# Patient Record
Sex: Female | Born: 1980 | Race: White | Hispanic: No | Marital: Married | Smoking: Never smoker
Health system: Southern US, Community
[De-identification: ages and names within clinical notes are randomized; demographics above are authoritative.]

---

## 2004-09-13 ENCOUNTER — Ambulatory Visit (HOSPITAL_COMMUNITY): Admission: RE | Admit: 2004-09-13 | Discharge: 2004-09-13 | Payer: Self-pay | Admitting: Family Medicine

## 2004-09-14 ENCOUNTER — Ambulatory Visit (HOSPITAL_COMMUNITY): Admission: RE | Admit: 2004-09-14 | Discharge: 2004-09-14 | Payer: Self-pay | Admitting: Family Medicine

## 2005-08-08 ENCOUNTER — Emergency Department (HOSPITAL_COMMUNITY): Admission: EM | Admit: 2005-08-08 | Discharge: 2005-08-08 | Payer: Self-pay | Admitting: Emergency Medicine

## 2005-11-02 ENCOUNTER — Observation Stay (HOSPITAL_COMMUNITY): Admission: AD | Admit: 2005-11-02 | Discharge: 2005-11-03 | Payer: Self-pay | Admitting: Obstetrics and Gynecology

## 2006-01-04 ENCOUNTER — Inpatient Hospital Stay (HOSPITAL_COMMUNITY): Admission: AD | Admit: 2006-01-04 | Discharge: 2006-01-07 | Payer: Self-pay | Admitting: Obstetrics and Gynecology

## 2006-10-24 ENCOUNTER — Ambulatory Visit: Payer: Self-pay | Admitting: Internal Medicine

## 2007-02-04 ENCOUNTER — Ambulatory Visit (HOSPITAL_COMMUNITY): Admission: RE | Admit: 2007-02-04 | Discharge: 2007-02-04 | Payer: Self-pay | Admitting: Internal Medicine

## 2007-02-04 ENCOUNTER — Ambulatory Visit: Payer: Self-pay | Admitting: Internal Medicine

## 2007-03-08 ENCOUNTER — Ambulatory Visit: Payer: Self-pay | Admitting: Internal Medicine

## 2008-06-25 ENCOUNTER — Other Ambulatory Visit: Admission: RE | Admit: 2008-06-25 | Discharge: 2008-06-25 | Payer: Self-pay | Admitting: Obstetrics and Gynecology

## 2008-10-14 ENCOUNTER — Ambulatory Visit: Payer: Self-pay | Admitting: Internal Medicine

## 2008-10-15 ENCOUNTER — Ambulatory Visit (HOSPITAL_COMMUNITY): Admission: RE | Admit: 2008-10-15 | Discharge: 2008-10-15 | Payer: Self-pay | Admitting: Internal Medicine

## 2008-11-04 ENCOUNTER — Ambulatory Visit: Payer: Self-pay | Admitting: Internal Medicine

## 2008-11-06 ENCOUNTER — Ambulatory Visit (HOSPITAL_COMMUNITY): Admission: RE | Admit: 2008-11-06 | Discharge: 2008-11-06 | Payer: Self-pay | Admitting: Family Medicine

## 2009-11-25 ENCOUNTER — Inpatient Hospital Stay (HOSPITAL_COMMUNITY): Admission: AD | Admit: 2009-11-25 | Discharge: 2009-11-27 | Payer: Self-pay | Admitting: Obstetrics & Gynecology

## 2009-11-25 ENCOUNTER — Ambulatory Visit: Payer: Self-pay | Admitting: Advanced Practice Midwife

## 2010-02-28 IMAGING — CT CT ABDOMEN W/ CM
1 of 3 series · 14 of 32 positions shown, 19 images · IV contrast (agent unspecified)
Comparison: None

CT ABDOMEN

CLINICAL DATA: Left side and lower abdominal pain, history rectal
bleeding, diverticulosis

CT ABDOMEN AND PELVIS WITH CONTRAST
TECHNIQUE: Multidetector CT imaging of the abdomen and pelvis was
performed using the standard protocol following bolus
administration of intravenous contrast. Sagittal and coronal MPR
images reconstructed from axial data set.
Contrast: Dilute oral contrast and 100 ml Mmnipaque-333

[Series 2: abd_pel 5.0 b40f · axial · 0.66mm/px · z∈[-471,-81]mm · 14 of 90 slices shown, 19 images]
[im 6/90  soft-tissue]
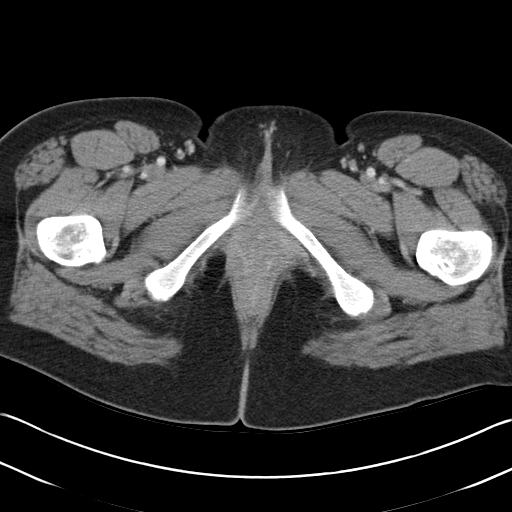
[im 6/90  bone]
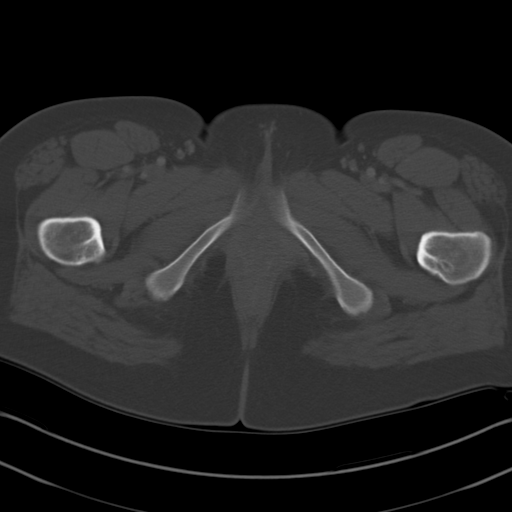
[im 11/90  soft-tissue]
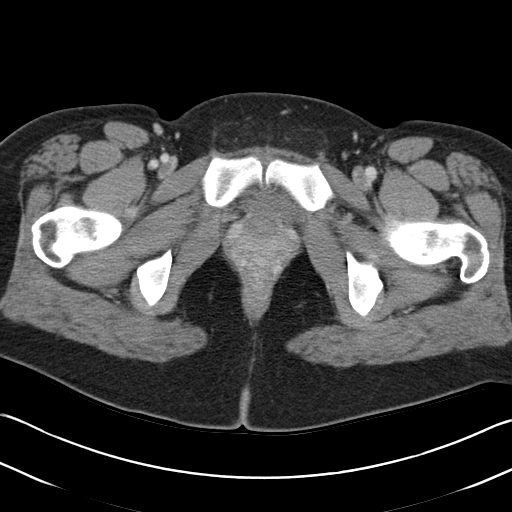
[im 21/90  soft-tissue]
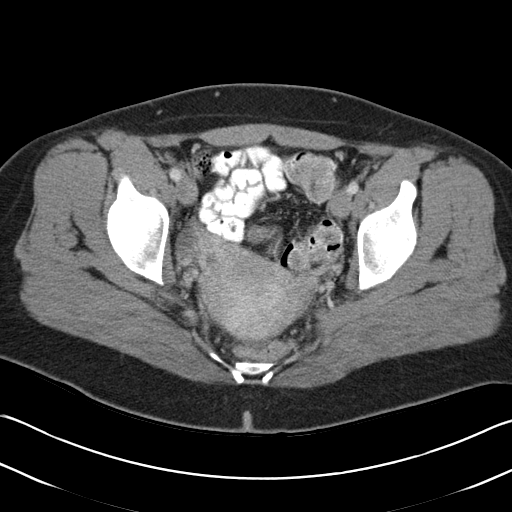
[im 27/90  soft-tissue]
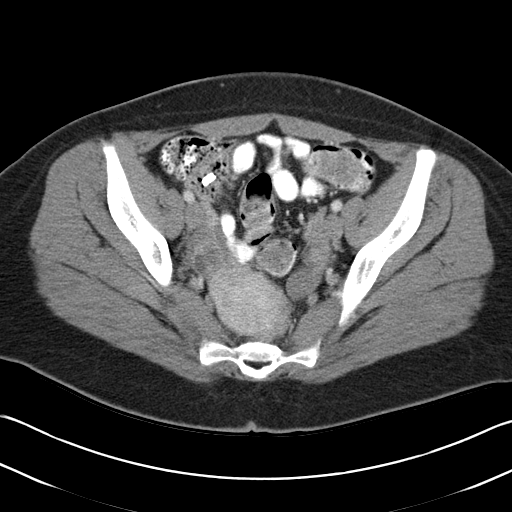
[im 32/90  soft-tissue]
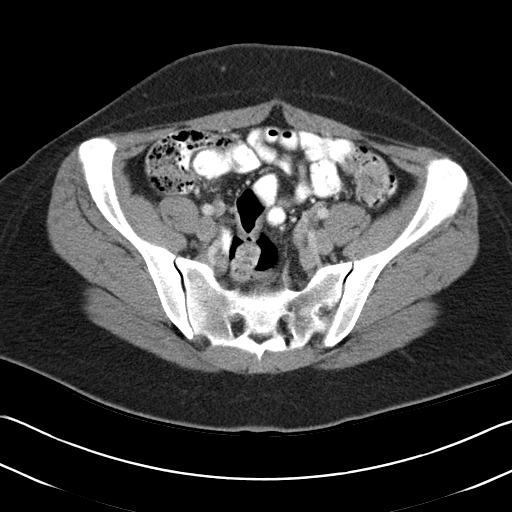
[im 37/90  soft-tissue]
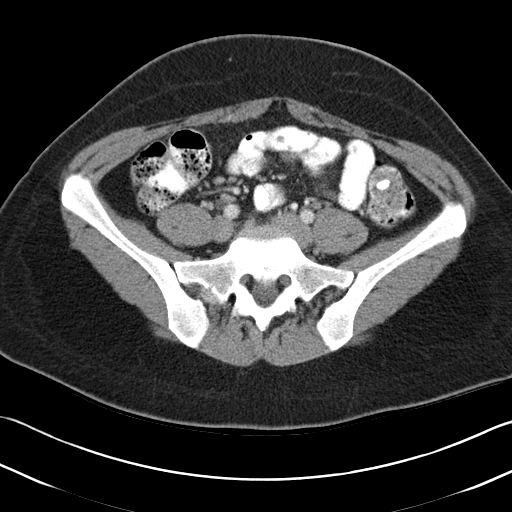
[im 48/90  soft-tissue]
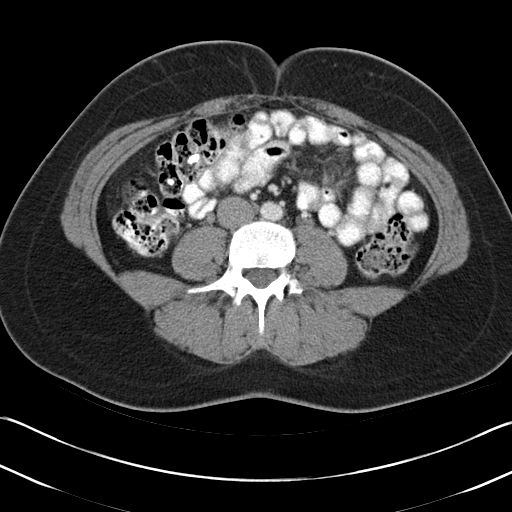
[im 53/90  soft-tissue]
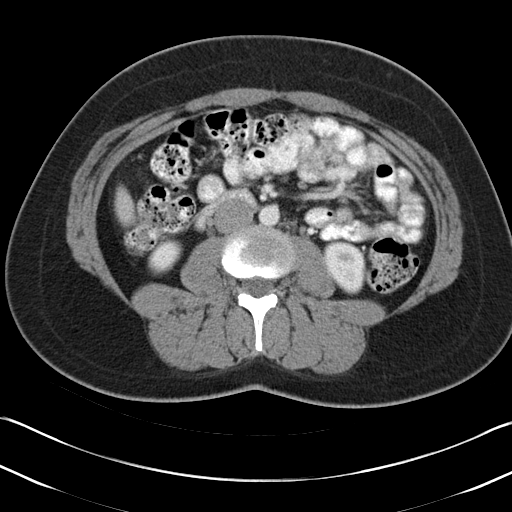
[im 58/90  soft-tissue]
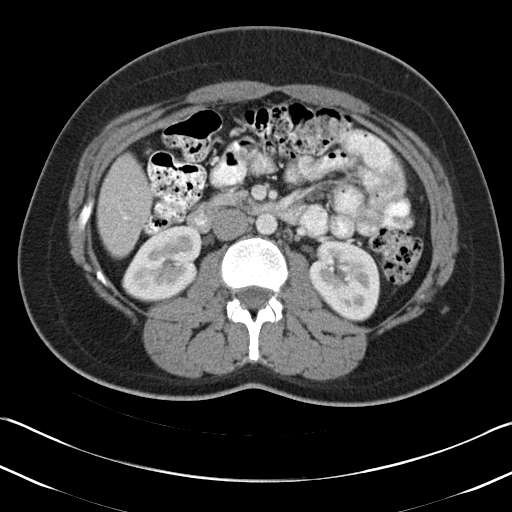
[im 58/90  bone]
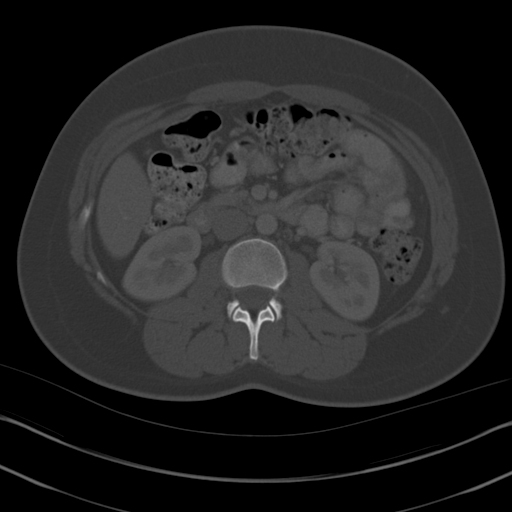
[im 63/90  soft-tissue]
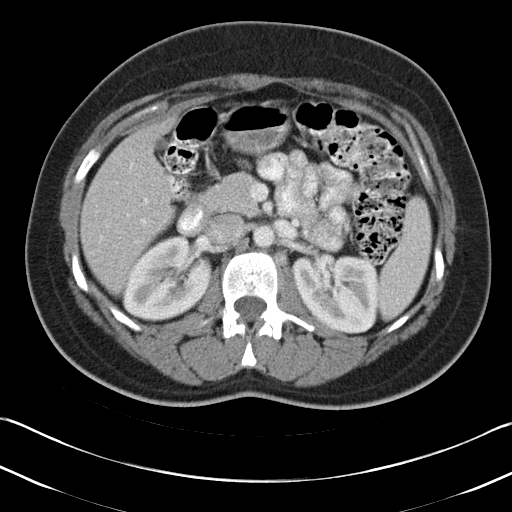
[im 69/90  soft-tissue]
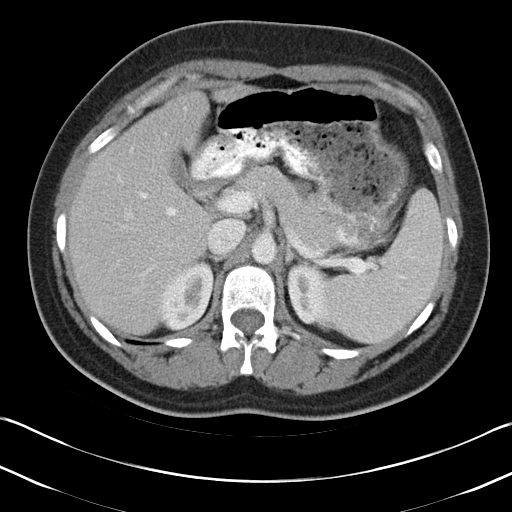
[im 69/90  lung]
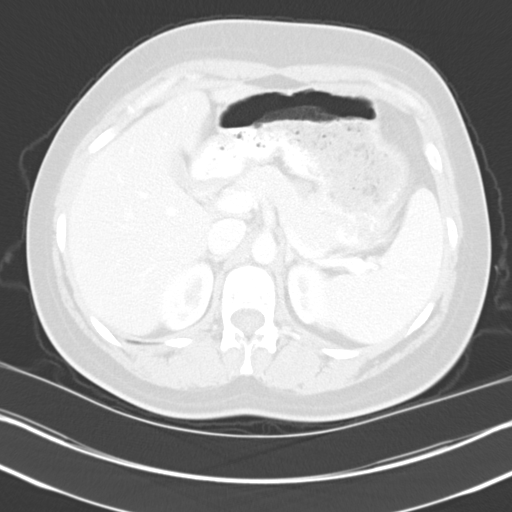
[im 74/90  lung]
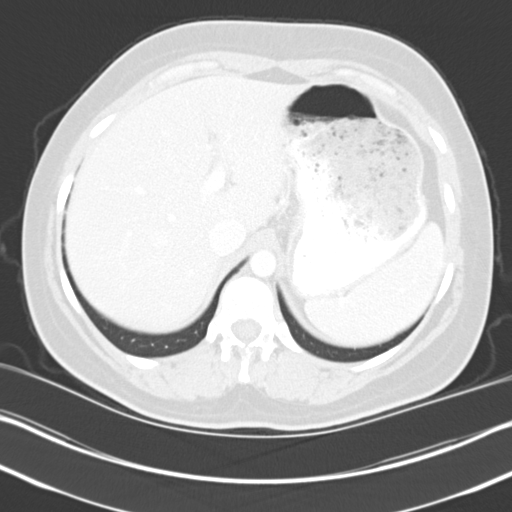
[im 79/90  soft-tissue]
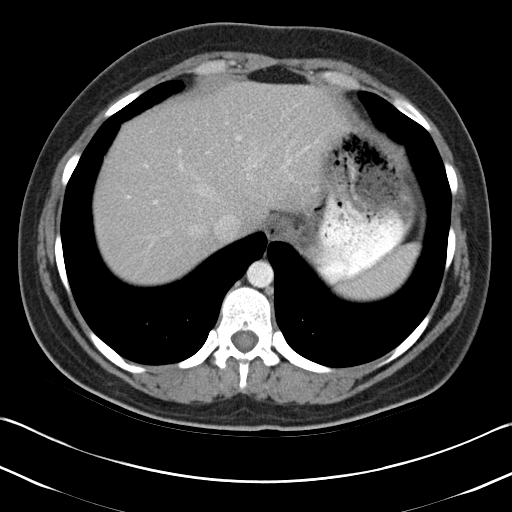
[im 79/90  lung]
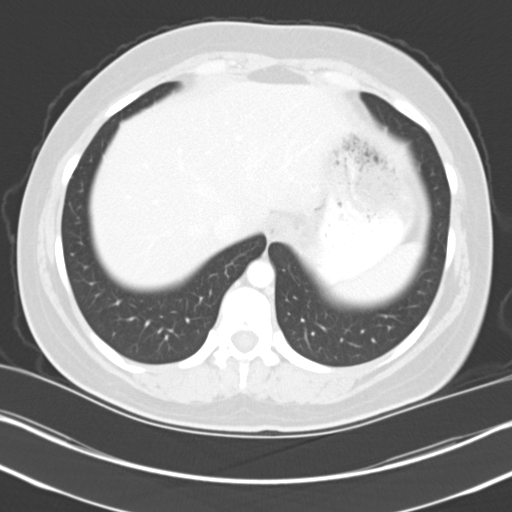
[im 84/90  soft-tissue]
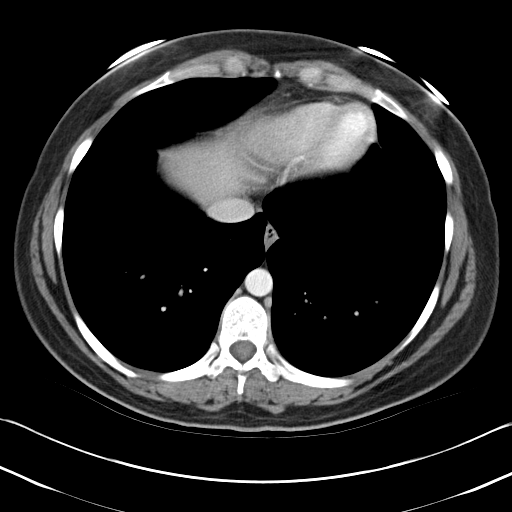
[im 84/90  lung]
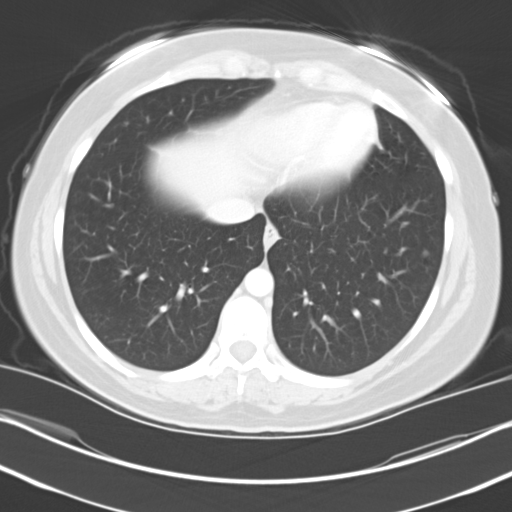

[14 of 32 positions shown; findings below may reference images not displayed]

FINDINGS: 5 mm diameter nonspecific nodular density left lower lobe image 6.
Liver, spleen, pancreas, kidneys, and adrenal glands normal.
Stomach and upper abdominal bowel loops unremarkable.
No mass, adenopathy, free fluid, or inflammatory process.
IMPRESSION: No acute upper abdominal abnormalities.
5 mm diameter nonspecific left lower lobe nodule, recommendations
below. If the patient is at high risk for bronchogenic carcinoma,
follow-up chest CT at 6-12 months is recommended.  If the patient
is at low risk for bronchogenic carcinoma, follow-up chest CT at 12
months is recommended.  This recommendation follows the consensus
statement: "Guidelines for Management of Small Pulmonary Nodules
Detected on CT Scans: A Statement from the [HOSPITAL]" as
[URL]

CT PELVIS
FINDINGS: Small cyst right ovary, 1.6 x 2.0 cm image 63.
Uterus and adnexae otherwise unremarkable.
Appendix not identified.
Large and small bowel loops in pelvis normal.
No pelvic mass, adenopathy, free fluid, or inflammatory process.
Bladder decompressed and unremarkable.
Bones unremarkable.
IMPRESSION: Small cyst right ovary.
Otherwise negative CT pelvis.

## 2010-03-22 IMAGING — US US EXTREM LOW VENOUS*L*
1 series · 14 of 24 positions shown · non-contrast
Comparison: None

CLINICAL DATA: Left leg pain and swelling



[Series 1: unknown · 14 of 38 slices shown]
[im 1/38]
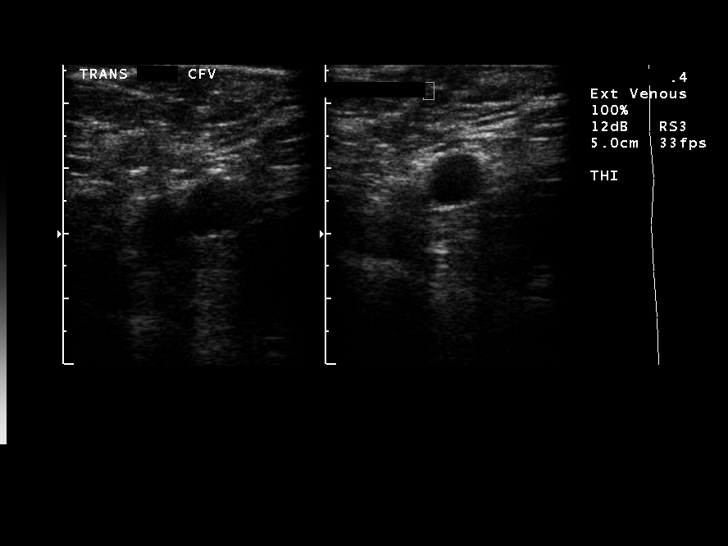
[im 4/38]
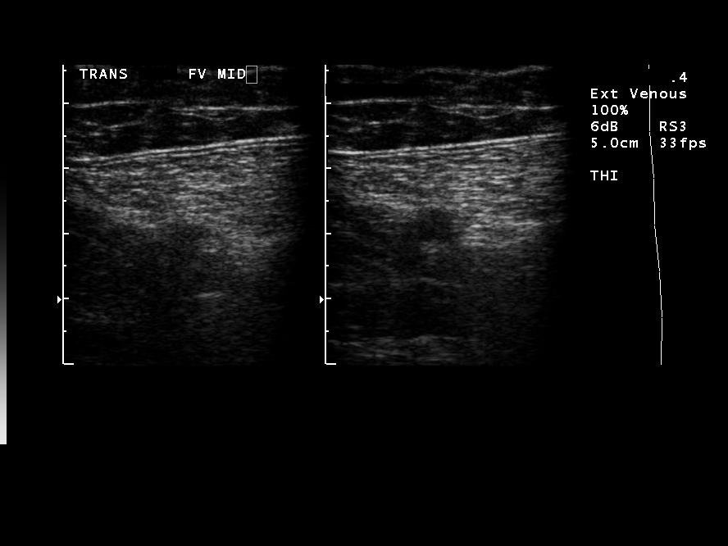
[im 7/38]
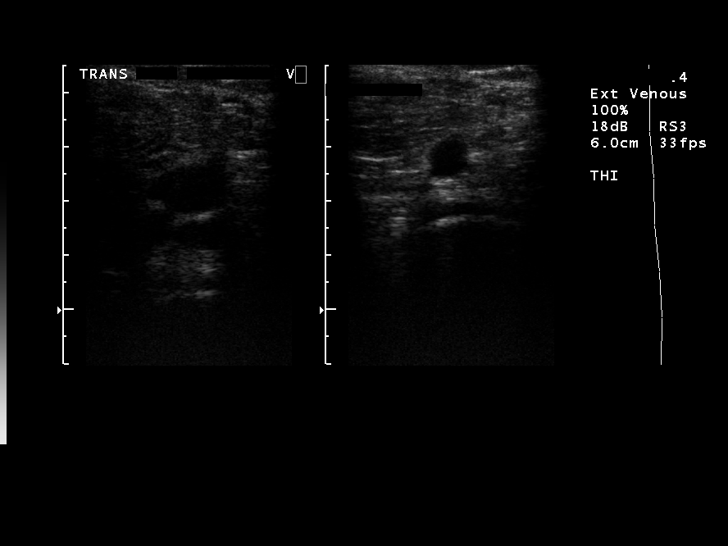
[im 10/38]
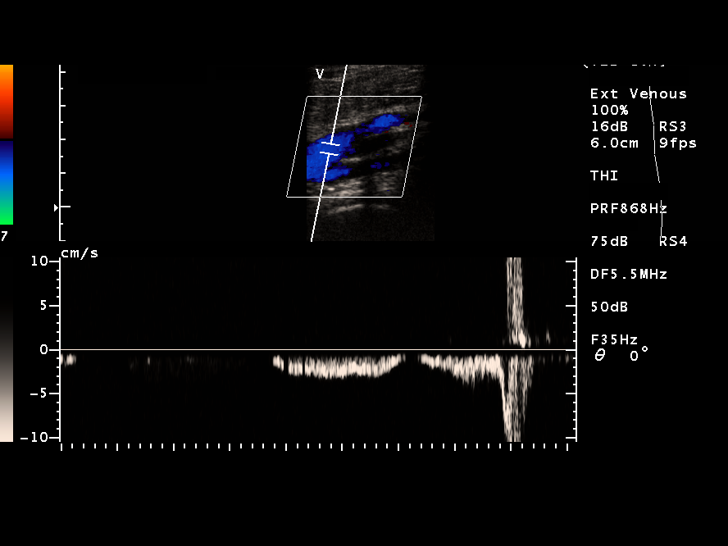
[im 12/38]
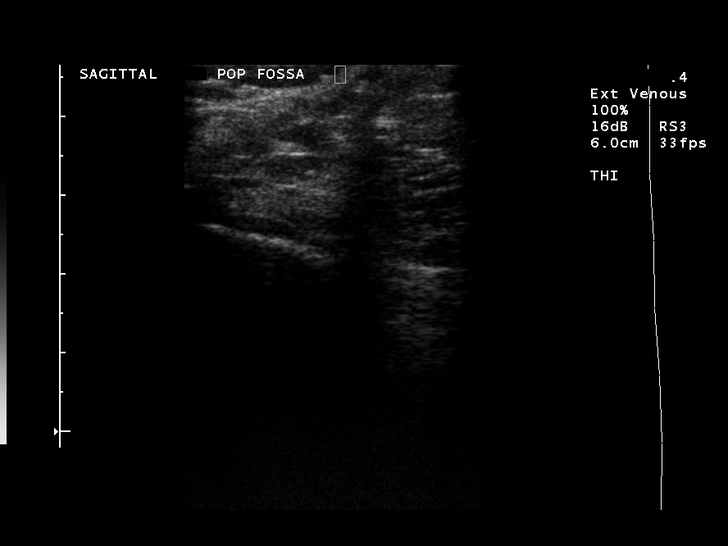
[im 15/38]
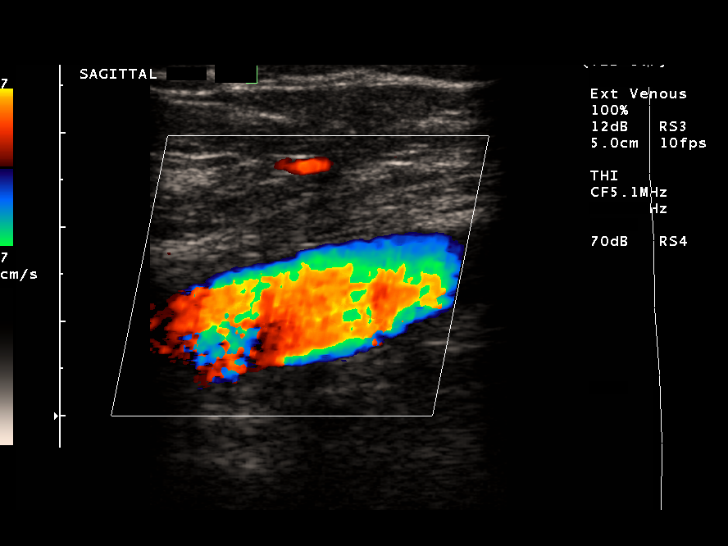
[im 18/38]
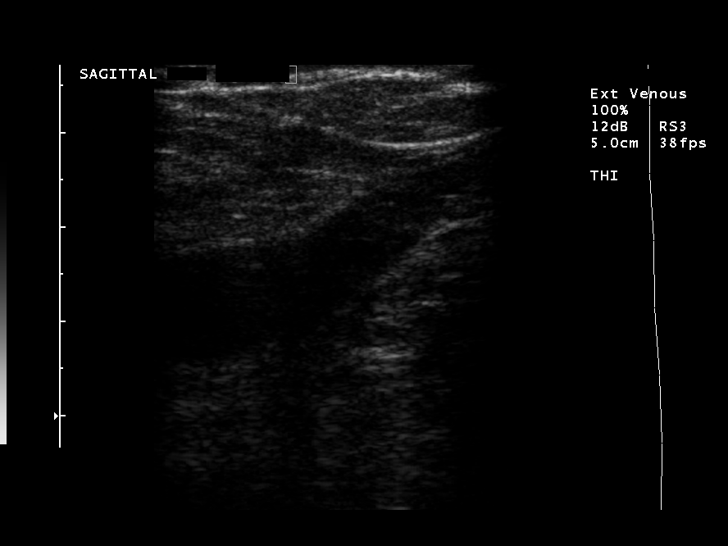
[im 20/38]
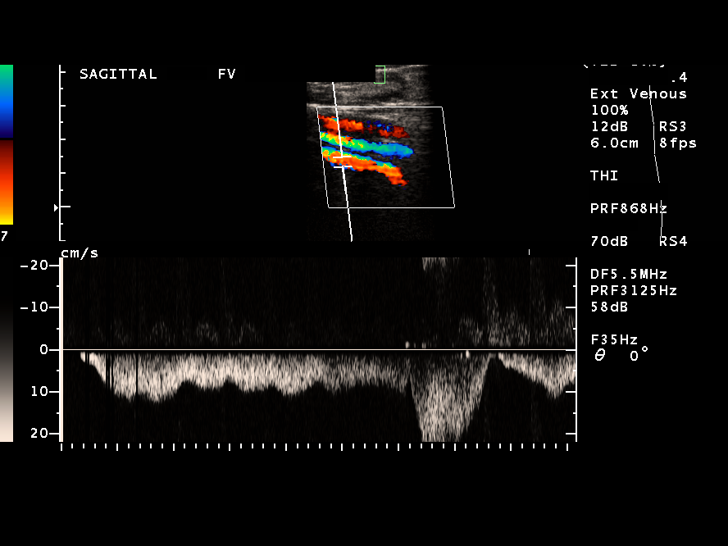
[im 23/38]
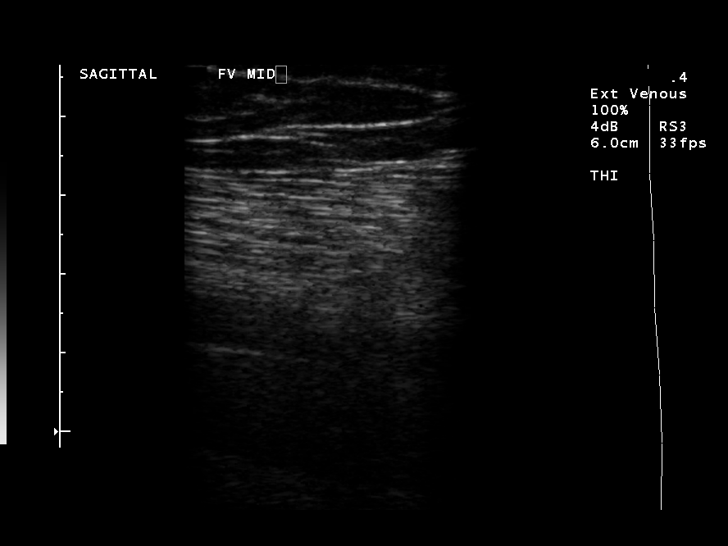
[im 26/38]
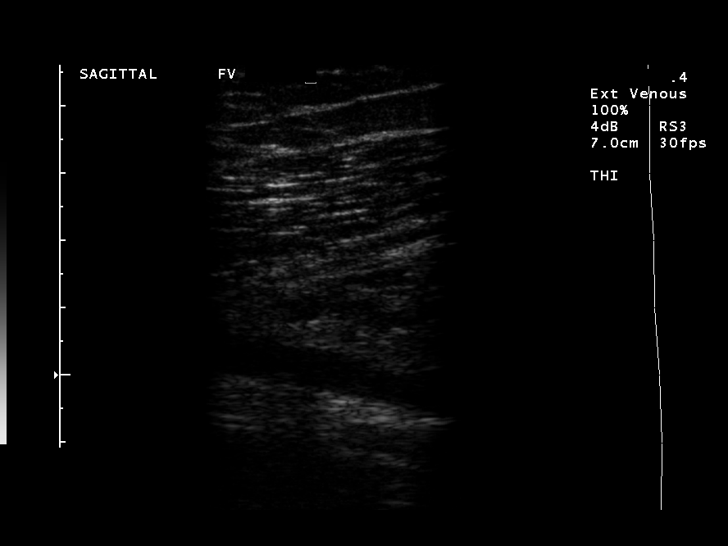
[im 29/38]
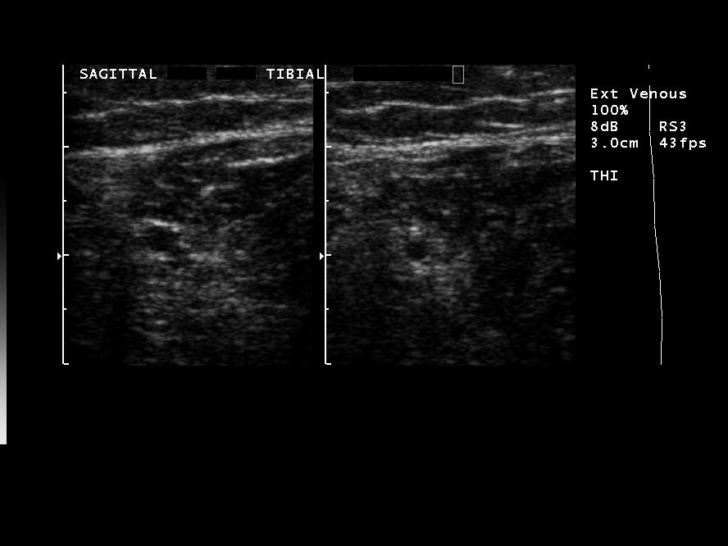
[im 31/38]
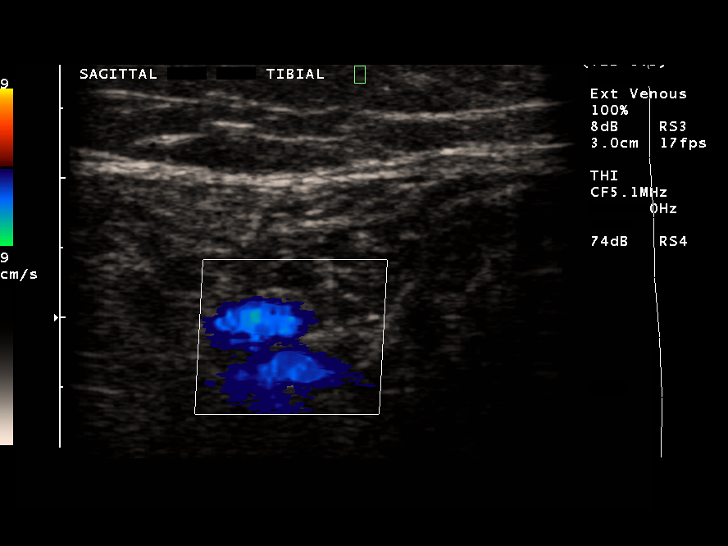
[im 34/38]
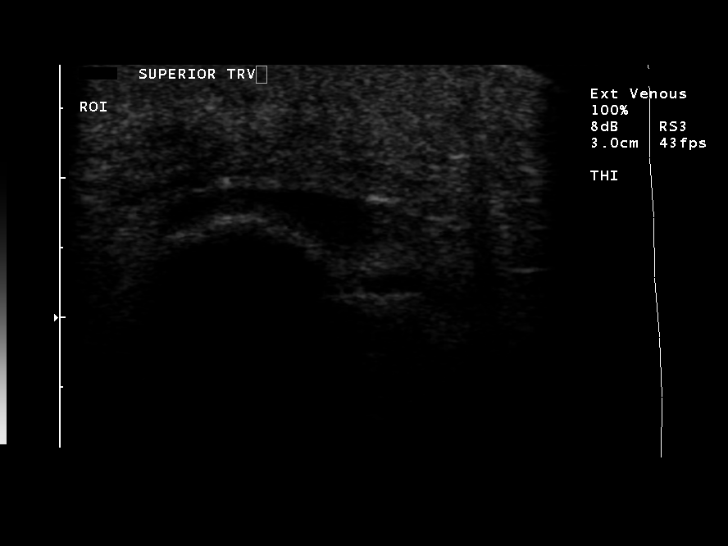
[im 38/38]
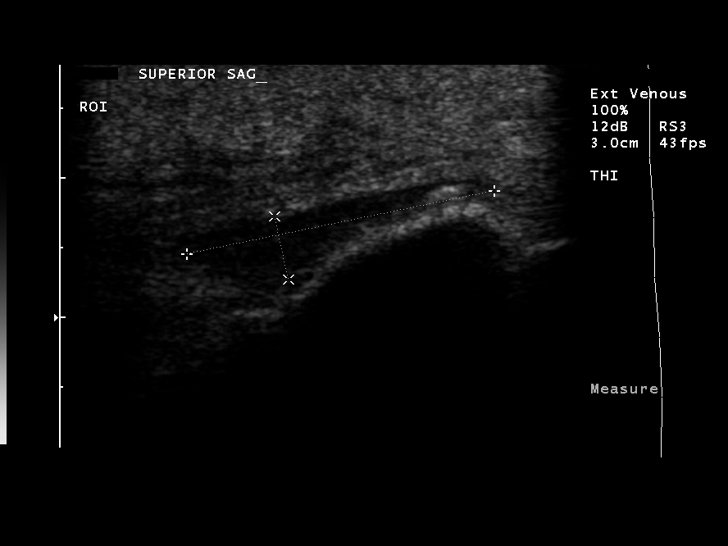

[14 of 24 positions shown; findings below may reference images not displayed]

FINDINGS: Normal compressibility, color Doppler flow and
augmentation involving the left common femoral vein, left femoral
vein and left popliteal vein.  There appears to be normal
compressibility and flow in the posterior tibial veins.  No
evidence for DVT.  There is a small hypoechoic area just
superficial to the shin in the region of interest.  This structure
measures 2.3 x 0.5 x 1.5 cm.  The finding is suggestive for a small
fluid collection.
IMPRESSION: Negative for left lower extremity DVT.

Small fluid collection just below the knee and along the anterior
tibia.

## 2011-02-27 LAB — CBC
HCT: 39.5 % (ref 36.0–46.0)
Hemoglobin: 13.2 g/dL (ref 12.0–15.0)
MCHC: 33.6 g/dL (ref 30.0–36.0)
MCV: 92.8 fL (ref 78.0–100.0)
Platelets: 176 10*3/uL (ref 150–400)
RBC: 4.25 MIL/uL (ref 3.87–5.11)
RDW: 13.4 % (ref 11.5–15.5)
WBC: 8.8 10*3/uL (ref 4.0–10.5)

## 2011-02-27 LAB — RPR: RPR Ser Ql: NONREACTIVE

## 2011-03-28 ENCOUNTER — Emergency Department (HOSPITAL_COMMUNITY)
Admission: EM | Admit: 2011-03-28 | Discharge: 2011-03-28 | Disposition: A | Discharge status: Home / Self Care | Payer: Self-pay | Attending: Emergency Medicine | Admitting: Emergency Medicine

## 2011-03-28 DIAGNOSIS — T6391XA Toxic effect of contact with unspecified venomous animal, accidental (unintentional), initial encounter: Secondary | ICD-10-CM | POA: Insufficient documentation

## 2011-03-28 DIAGNOSIS — T63391A Toxic effect of venom of other spider, accidental (unintentional), initial encounter: Secondary | ICD-10-CM | POA: Insufficient documentation

## 2011-03-28 DIAGNOSIS — Y929 Unspecified place or not applicable: Secondary | ICD-10-CM | POA: Insufficient documentation

## 2011-04-11 NOTE — Assessment & Plan Note (Signed)
NAMEMANESSA, [name]                  CHART#:  [id number]   DATE:  10/14/2008                       DOB:  June 13, 1981   FOLLOWUP:  History of chronic abdominal pain, constipation, and rectal  bleeding.  Last seen on 03/08/2007 in the office in followup.  She  underwent an ileal colonoscopy on 02/04/2007.  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  Terminal ileum appeared normal.  A 4 or 5 diet was recommended  along with Benefiber and some Anusol suppositories.  She had tried some  Amitiza, but was off her control pills, trying to get pregnant she  stopped that agent.  She has been on MiraLax, GlycoLax off and on.  Over  the past 1 year, she states she has had unrelenting abdominal pain in  the setting of worsening constipation.  She really complains for the  past few months, left lower quadrant of abdomen.  He has not had a  fever.  Has had some episodes of rectal bleeding, but the bleeding  episodes are actually tapered off.  She says she may take GlycoLax 5  days in a row and then only have a modest bowel movement.  She has not  had any nausea or vomiting.  No odynophagia, dysphagia, early sign of  reflux symptoms, nausea, or vomiting.  Weight is up by 1 pound compared  to when she was originally seen here in 2007.  She is relocated from  [place].  She did have a CAT scan there a few years ago without  significant findings.  Reportedly, she saw Angelica Overly, PA-C, down at  [facility] prior to coming here, and she empirically gave her a  course of Septra for diverticulitis.  She is on fertility medications,  and she was found to have a negative pregnancy test 2 days ago at  [facility] per her report.  She counts her fiber grams and tells me she  takes 20-30 grams of fiber daily.  There is no family history of GI  neoplasia.   She was started on some Septra  for presumed diverticulitis several days  ago.  She tells me that her abdominal pain has improved significantly,  but has not completely resolved.   PAST MEDICAL HISTORY:  Significant diverticulosis and chronic abdominal  pain.   PAST SURGICAL HISTORY:  Right finger surgery.   CURRENT MEDICATIONS:  1. Multivitamin, stool softener 2 tablets daily.  2. Benefiber daily, Septra DS 800 mg daily, vitamin D daily, and folic      acid daily.   ALLERGIES:  Amoxicillin.   FAMILY HISTORY:  No history of chronic GI or liver illness.  One  grandfather did have lung cancer.   SOCIAL HISTORY:  The patient is married and has 1 healthy child.  She  works for a new Programme researcher, broadcasting/film/video.  No tobacco, no alcohol, and no illicit  drugs.   REVIEW OF SYSTEMS:  No chest pain, no dyspnea on exertion, no fever or  chills.   PHYSICAL EXAMINATION:  GENERAL:  A 30 year old lady resting comfortably.  VITAL SIGNS:  Weight 181, height 5 feet 4 inches, temperature 99, BP  110/82, and pulse 80.  HEENT:  No scleral icterus.  Conjunctivae pink.  CHEST:  Lungs are clear to auscultation.  CARDIAC:  Regular rate and rhythm without murmur, gallop, or rub.  BREASTS:  Deferred.  ABDOMEN:  Nondistended, positive bowel sounds, soft.  She does have  localized left lower quadrant tenderness to palpation with some  guarding.  No rebound.  I do not appreciate a mass or any organomegaly.  EXTREMITIES:  No edema.  RECTAL:  No external lesions.  Moderately tender anal canal.  No mass.  No rectal vault.  Minimal brown stool in the rectal vault.  Hemoccult  negative.   IMPRESSION:  The patient is a 30 year old lady with chronic abdominal  pain and constipation worse over the past 1 year and more recently  localized in the left lower quadrant.  She has had a relatively poor  response to [employer].  She is taking rather hefty amounts of fiber  everyday and really has not had a great response to fiber supplements,  stool softeners, and  GlycoLax.  She is being treated empirically for  diverticulitis.  I am somewhat concerned about her left lower quadrant  abdominal pain and feel that further evaluation is also warranted.   RECOMMENDATIONS:  For the time being, I told the patient actually back  off from her fiber supplementation significantly and just pretty much  eat a balanced diet without supplementation for now.  We will proceed  with abdominal pelvic CT with IV and oral contrast.  We have a window,  since we know she is not pregnant.  Once we have the CT for review, we  will make further recommendations in regards to management of bowel  function.  She is urged to finish out a course of Septra, which has  already been started.  Further recommendations to follow.       Jonathon Bellows, M.D.  Electronically Signed     RMR/MEDQ  D:  10/14/2008  T:  10/15/2008  Job:  [id number]   cc:   Tilda Burrow, M.D.

## 2011-04-11 NOTE — Assessment & Plan Note (Signed)
[name], [name]                  CHART#:  [id number]   DATE:  11/04/2008                       DOB:  01-10-1981   CHIEF COMPLAINT:  Followup of constipation and abdominal pain.   SUBJECTIVE:  The patient is here for a followup visit.  She was last  seen on 10/14/2008.  She has a history of chronic abdominal pain and  constipation.  Pain mostly in the left lower quadrant region.  Since she  was last here, she had a CT of the abdomen and pelvis, which revealed a  5-mm nonspecific nodularity in the left lower lobe, followup CT in 12  months recommended.  Otherwise, unremarkable.  Nothing to explain her  abdominal pain.  We felt that she likely has IBS or constipation.  She  has been on MiraLax.  She was increased to 17 g b.i.d.  She states after  several days of this, she had diarrhea.  She would have a watery stool  every time she went to urinate.  She had problems with urgency.  If she  takes only 17 g daily is not enough.  As long as her bowels are moving,  her abdominal pain is minimal.  She is having some problems with rectal  pain whenever she passes her stool.  She may have an anorectal fissure  as suggested by her past digital rectal exam.  When her stools are  really soft she has less discomfort.  The patient has not had a bowel  movement about 3 days at this point.   CURRENT MEDICATIONS:  See updated list.   ALLERGIES:  Amoxicillin.   PHYSICAL EXAMINATION:  VITAL SIGNS:  Weight 178, temp 98.3, blood  pressure 106/76, and pulse 80.  GENERAL:  A pleasant, well-nourished, well-developed Caucasian female in  no acute distress.  SKIN:  Warm and dry.  No jaundice.  HEENT:  Sclerae nonicteric.  Oropharyngeal mucosa moist and pink.  ABDOMEN:  Positive bowel sounds.  Abdomen is soft and nondistended.  She  has mild tenderness in the left lower quadrant region to deep palpation.  No rebound or guarding.  No organomegaly or masses.  No abdominal bruits  or hernia.  LOWER  EXTREMITIES:  No edema.   IMPRESSION:  The patient is a 30 year old lady with history of chronic  abdominal pain and constipation.  The patient's abdominal pain improves  whenever her bowels are moving regularly.  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  We just need to find the correct regimen to have her bowel  movements more regular without overshooting the response.   RECOMMENDATIONS:  1. She will try MiraLax one and half capsule daily.  If she needs to,      she can add back Colace one tablet daily.  2. Anusol-HC Suppositories one per rectum at bedtime for 2 weeks, 0      refills.  3. She will call us if she has any further problems.  4. I have the patient to touch base with her OB/GYN who helps her on      fertility treatments to verify that her medication recommendations      would not interfere or be problematic if she were to get pregnant.       Tana Coast, P.A.  Electronically Signed     R. Casimiro Needle  Rourk, M.D.  Electronically Signed    LL/MEDQ  D:  11/04/2008  T:  11/05/2008  Job:  [id number]   cc:   Tilda Burrow, M.D.

## 2011-04-14 NOTE — Op Note (Signed)
[name], [name]                 ACCOUNT NO.:  [id number]   MEDICAL RECORD NO.:  [id number]          PATIENT TYPE:  INP   LOCATION:  A402                          FACILITY:  [facility]   PHYSICIAN:  Tilda Burrow, M.D. DATE OF BIRTH:  1981-09-27   DATE OF PROCEDURE:  DATE OF DISCHARGE:                                 OPERATIVE REPORT   PROCEDURE:  Epidural catheter placement. Continuous lumbar epidural catheter  placed at L2-3 interspace after the patient prepped and draped in standard  fashion with placing in sitting position, flexed forward. A loss-of-  resistance technique was used and an initial bolus of 5 cc 1.5% lidocaine  with epinephrine infused followed by insertion of the epidural catheter into  the space 3 cm. Needle was removed. Catheter taped to the back and then a 10  cc bolus of 0.125% Marcaine with fentanyl infusion performed followed by 12  cc per hour continuous infusion. The patient had symmetric analgesic effect.  Fifteen minutes after procedure, she was found to be completely dilated.   DELIVERY NOTE:  [name] progressed with the epidural slide reaching +3  station with ease. She then was able to push through a brief second stage of  less than 15 minutes of active pushing reaching delivery from a direct OA  position of a petite infant who appeared closer to 35 weeks' gestation. The  infant delivered from direct OA position easily without difficulty, and  Apgars were 9 and 9. The infant weighed 4 pounds 12 ounces (2,100 grams).  There was no lacerations requiring repair. First degree labial laceration on  the right will be allowed to heal spontaneously. Placenta delivered easily,  Tomasa Blase presentation, had three-vessel cord confirmed and had a central cord  insertion. The total placental volume was quite small. During the labor, the  patient's blood pressures remained excellent in response to the epidural and  analgesics.      Tilda Burrow, M.D.  Electronically Signed     JVF/MEDQ  D:  01/04/2006  T:  01/05/2006  Job:  [id number]   cc:   [facility]   Francoise Schaumann. Milford Cage DO, FAAP  Fax: [phone]

## 2011-04-14 NOTE — Discharge Summary (Signed)
[name], [name]                 ACCOUNT NO.:  [id number]   MEDICAL RECORD NO.:  [id number]          PATIENT TYPE:  OIB   LOCATION:  A415                          FACILITY:  [facility]   PHYSICIAN:  Tilda Burrow, M.D. DATE OF BIRTH:  [birth year]   DATE OF ADMISSION:  11/02/2005  DATE OF DISCHARGE:  12/08/2006LH                                 DISCHARGE SUMMARY   REASON FOR ADMISSION:  Observation x16 hours.   HOSPITAL COURSE:  This is a 30 year old female at 52 weeks presenting with  abdominal aches and discomfort.  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  She is caring for her mother and six of  her mother's children under the age of 42 who have been visiting this week.  She came in yesterday with aches and pains with minor contractions which  quickly resolved.  This a.m., she is stated to go home.   Cervical exam is interesting.  Cervix is very soft, multiparous even though  she is a G1, P0.  Presenting part is not well-applied to the cervix.  Will  monitor at follow-up appointment.  The patient is aware of symptoms of  preterm labor and will report to office promptly if things change.      Tilda Burrow, M.D.  Electronically Signed     JVF/MEDQ  D:  11/03/2005  T:  11/03/2005  Job:  [id number]

## 2011-04-14 NOTE — Discharge Summary (Signed)
NAMECALE, DECAROLIS                 ACCOUNT NO.:  [id number]   MEDICAL RECORD NO.:  [id number]          PATIENT TYPE:  INP   LOCATION:  A402                          FACILITY:  [facility]   PHYSICIAN:  Tilda Burrow, M.D. DATE OF BIRTH:  06-Feb-1981   DATE OF ADMISSION:  01/04/2006  DATE OF DISCHARGE:  02/11/2007LH                                 DISCHARGE SUMMARY   ADMISSION DIAGNOSES:  1.  Pregnancy at 38 weeks, 2 days.  2.  Proteinuria.  3.  Hypertension.  4.  Edema.   DISCHARGE DIAGNOSES:  1.  Pregnancy at 37-1/2 weeks, delivered.  2.  Preeclampsia.   PROCEDURES:  Magnesium sulfate prophylaxis, Foley bulb cervical ripening,  Pitocin induction of labor.  All those are on January 04, 2006.  On January 03, 2006, lumbar epidural catheter placement by Jannifer Franklin.  Also,  January 04, 2006, spontaneous vertex vaginal delivery by Jannifer Franklin.   DISCHARGE MEDICATIONS:  Labetalol 100 mg p.o. b.i.d. x10 days.   HOSPITAL SUMMARY:  This 30 year old female gravida 1, para 0, due January 21, 2006, is admitted after being seen with signs and symptoms suggestive of  preeclampsia.  She was admitted on January 03, 2006.  She complained of  intermittent headaches along with epigastric tenderness noted.  She was  observed overnight with the patient's condition deteriorating.  She began to  have blood pressures as high as 170/96 overnight and was begun on magnesium  sulfate therapy, and therefore, induced.  Foley bulb was inserted on  January 04, 2006.  Pitocin was tried at low doses x4 hours.  Excellent  response to the Foley bulb.  She then progressed in labor, reached 8 cm  dilated before requesting epidural.  Her blood pressures remained normal  through this part of the labor.  Epidural was placed and within two hours  she delivered spontaneously as described in the delivery note with a first  degree labial laceration, not requiring repair.   POSTPARTUM COURSE:  The patient had 24  hours of postpartum magnesium sulfate  and then it was discontinued.  The baby was a small 4 pound 12 ounce female,  Apgars 8 and 9, that did well.  Baby showed some reluctance to breast feed.  The patient shows excellent nurturing.   She will be discharged home on the morning of January 07, 2006, for follow  up four days in our office for blood pressure check.  She is afebrile.  Blood pressures have been 150-160/79-95 with a reflex of 2+.  No headaches,  scatoma, right upper quadrant pain.  Follow up will be in four days.      Tilda Burrow, M.D.  Electronically Signed     JVF/MEDQ  D:  01/07/2006  T:  01/08/2006  Job:  [id number]

## 2011-04-14 NOTE — H&P (Signed)
[name], [name]                 ACCOUNT NO.:  [id number]   MEDICAL RECORD NO.:  [id number]          PATIENT TYPE:  OBV   LOCATION:  LDR3                          FACILITY:  [facility]   PHYSICIAN:  Tilda Burrow, M.D. DATE OF BIRTH:  01/20/1981   DATE OF ADMISSION:  01/03/2006  DATE OF DISCHARGE:  LH                                HISTORY & PHYSICAL   REASON FOR ADMISSION:  Pregnancy at 37 weeks and two days with proteinuria  and hypertension and pitting edema.   PAST MEDICAL HISTORY:  Negative.   PAST SURGICAL HISTORY:  Positive for surgery on her right ring finger.   ALLERGIES:  BEES, AMOXICILLIN.   SOCIAL HISTORY:  She is married.   FAMILY HISTORY:  Positive for coronary artery disease and diabetes.   PRENATAL COURSE:  Uneventful up until this point.  Yesterday, she was seen  in the office and had an elevation of 110/92 and 1+ protein at that time.  DTR's were 2+ and there was 3+ pitting edema.  She is seen back in today and  her blood pressure is 140/100 and 130/100.   Blood type is O positive.  UDS is negative.  Rubella immune, hepatitis B  surface antigen negative, HIV negative, HSV negative, serology nonreactive.  Pap is normal.  GC/Chlamydia were negative.  At 28 weeks, hemoglobin 12.5,  hematocrit 37.3, monitored glucose 123.   PHYSICAL EXAMINATION:  VITAL SIGNS:  Blood pressure 140/100, 130/100.  GENERAL APPEARANCE:  Dizziness, headache and she is seeing silver fish she  states.  EXTREMITIES:  She has pitting edema in her lower extremities, all the way up  to the knees.   PLAN:  We are going to admit for preeclampsia, do a preeclampsia evaluation  and get Dr. Despina Hidden and Dr.  Emelda Fear to consult.      Zerita Boers, Lanier Clam      Tilda Burrow, M.D.  Electronically Signed    DL/MEDQ  D:  04/54/0981  T:  01/03/2006  Job:  [id number]   cc:   Francoise Schaumann. Milford Cage DO, FAAP  Fax: [phone]

## 2011-04-14 NOTE — H&P (Signed)
NAMEKAYREN, HOLCK                 ACCOUNT NO.:  [id number]   MEDICAL RECORD NO.:  [id number]          PATIENT TYPE:  OIB   LOCATION:  A415                          FACILITY:  [facility]   PHYSICIAN:  Tilda Burrow, M.D. DATE OF BIRTH:  02-22-1981   DATE OF ADMISSION:  11/02/2005  DATE OF DISCHARGE:  LH                                HISTORY & PHYSICAL   HISTORY OF PRESENT ILLNESS:  Jaira came in with complaints of some lower  abdominal tightening and cramping.  She has been having it off and on today  since this morning just getting it about every 5 minutes this afternoon.  She came in showing what looked like maybe some irritability on the monitor.  Her cervix is very anterior, although closed and thick per R.N. exam.  She  is just 29 weeks' gestation with her first baby.  I gave her a shot of subcu  terbutaline to which looks like the irritability calmed down, but the  patient is still complaining of some cramping and tightening about every 4-5  minutes.  I have been in there during several of these episodes and nothing  is palpable or showing up on the monitor.  The fetal heart rate looks fine,  but in lieu of her obvious discomfort every few minutes, the anterior cervix  and the early gestation, we will watch her overnight and reexamine her  cervix in the morning.  Will also do a CBC.  Her urinalysis was clean.  She  has received 14 mg of IM morphine with hopes of quieting her uterus with a  regular diet.      Jacklyn Shell, C.N.M.      Tilda Burrow, M.D.  Electronically Signed    FC/MEDQ  D:  11/02/2005  T:  11/02/2005  Job:  [id number]   cc:   [facility]

## 2011-04-14 NOTE — Op Note (Signed)
NAMECATHERENE, [name]                 ACCOUNT NO.:  [id number]   MEDICAL RECORD NO.:  [id number]          PATIENT TYPE:  AMB   LOCATION:  DAY                           FACILITY:  [facility]   PHYSICIAN:  R. Roetta Sessions, M.D. DATE OF BIRTH:  27-Mar-1981   DATE OF PROCEDURE:  02/04/2007  DATE OF DISCHARGE:                               OPERATIVE REPORT   INDICATIONS FOR PROCEDURE:  The patient is a 30 year old lady with  constipation and intermittent rectal bleeding.  Glycolax daily for a  period of time did help bowel function significantly, but she has become  more constipated and having some low-volume hematochezia from time to  time.  No family history of colorectal neoplasia.  Never had lower GI  tract imaging previously.  Colonoscopy is now being done.  This approach  has been discussed with the patient at length.  Potential risks,  benefits and alternatives have been reviewed, questions answered.  Please see documentation in the medical record.   PROCEDURE NOTE:  Oxygen saturation, blood pressure, pulse, and  respirations monitored throughout the entire procedure.  Conscious  sedation with Versed 6 mg IV and Demerol 125 mg IV in divided doses.  Instrument Pentax video chip system.   FINDINGS:  Digital rectal exam revealed no abnormalities.   DESCRIPTION OF PROCEDURE:  The prep was good.  Examination of the  colonic mucosa was undertaken from the rectosigmoid junction through the  left, transverse and right colon to the appendiceal orifice, ileocecal  valve and cecum.  These structures well seen and photographed for the  record.  Terminal ileum was intubated 10 cm.  From this level scope was slowly  and cautiously withdrawn.  All previously mentioned  mucosal surfaces  were again seen.  The patient had mildly pigmented colonic mucosa  consistent with melanosis coli.  Had scattered sigmoid diverticula.  The  remainder of the colonic mucosa appeared normal.  Scope was pulled down  into the rectum where a thorough examination of the rectal mucosa  including retroflexed view of the anal verge __________ the canal  demonstrated minimal anal canal hemorrhoids only.  The patient tolerated  the procedure well and was reactive after endoscopy.   IMPRESSION:  1. Anal canal hemorrhoids, otherwise normal rectum.  2. Left-sided diverticula.  3. 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  4. Normal terminal ileum.   Suspect the patient has bled from hemorrhoids.   RECOMMENDATIONS:  1. High-fiber diet.  2,  Constipation literature provided to [name].  1. Daily Benefiber or fiber supplement at 1 tablespoon daily.  2. Anusol-HC suppositories one per rectum bedtime times 10 days.  3. Begin Amitiza 1 tablet with food for 5 days,  then increased to 1      tablet with food twice daily.  4. Plan to see his nice lady back in 1 month.   The patient denies pregnancy this time.      Jonathon Bellows, M.D.  Electronically Signed     RMR/MEDQ  D:  02/04/2007  T:  02/04/2007  Job:  [id number]   cc:  Tilda Burrow, M.D.  Fax: [phone]

## 2011-07-19 ENCOUNTER — Encounter: Payer: Self-pay | Admitting: Emergency Medicine

## 2011-07-19 ENCOUNTER — Emergency Department (HOSPITAL_COMMUNITY)
Admission: EM | Admit: 2011-07-19 | Discharge: 2011-07-19 | Disposition: A | Discharge status: Home / Self Care | Payer: No Typology Code available for payment source | Attending: Emergency Medicine | Admitting: Emergency Medicine

## 2011-07-19 DIAGNOSIS — M542 Cervicalgia: Secondary | ICD-10-CM | POA: Insufficient documentation

## 2011-07-19 DIAGNOSIS — M545 Low back pain, unspecified: Secondary | ICD-10-CM | POA: Insufficient documentation

## 2011-07-19 MED ORDER — IBUPROFEN 800 MG PO TABS
800.0000 mg | ORAL_TABLET | Freq: Once | ORAL | Status: AC
  Administered 2011-07-19: 800 mg via ORAL

## 2011-07-19 MED ORDER — IBUPROFEN 600 MG PO TABS: 600.0000 mg | ORAL_TABLET | Freq: Four times a day (QID) | ORAL | Status: AC | PRN
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
## 2018-03-28 ENCOUNTER — Ambulatory Visit (HOSPITAL_COMMUNITY): Payer: Self-pay

## 2023-05-28 ENCOUNTER — Ambulatory Visit: Payer: Commercial Managed Care - PPO | Admitting: Physical Therapy

## 2023-05-30 ENCOUNTER — Other Ambulatory Visit: Payer: Self-pay

## 2023-05-30 ENCOUNTER — Ambulatory Visit: Payer: Commercial Managed Care - PPO | Attending: Orthopedic Surgery

## 2023-05-30 DIAGNOSIS — R2689 Other abnormalities of gait and mobility: Secondary | ICD-10-CM | POA: Insufficient documentation

## 2023-05-30 DIAGNOSIS — M25561 Pain in right knee: Secondary | ICD-10-CM | POA: Insufficient documentation

## 2023-05-30 DIAGNOSIS — G8929 Other chronic pain: Secondary | ICD-10-CM | POA: Insufficient documentation

## 2023-05-30 NOTE — Therapy (Signed)
OUTPATIENT PHYSICAL THERAPY LOWER EXTREMITY EVALUATION   Patient Name: [name] MRN: [id number] DOB:Oct 06, 1981, 42 y.o., female Today's Date: 05/30/2023  END OF SESSION:  PT End of Session - 05/30/23 0847     Visit Number 1    Number of Visits 10    Date for PT Re-Evaluation 08/24/23    PT Start Time 0849    PT Stop Time 0936    PT Time Calculation (min) 47 min    Activity Tolerance Patient tolerated treatment well    Behavior During Therapy [facility] for tasks assessed/performed             History reviewed. No pertinent past medical history. History reviewed. No pertinent surgical history. There are no problems to display for this patient.   PCP: Juliette Alcide, MD  REFERRING PROVIDER: Teryl Lucy, MD  REFERRING DIAG: Right knee pain  THERAPY DIAG:  Chronic pain of right knee  Other abnormalities of gait and mobility  Rationale for Evaluation and Treatment: Rehabilitation  ONSET DATE: Chronic with exacerbation on March 2024   SUBJECTIVE:   SUBJECTIVE STATEMENT: Patient reports that she has had problem with both of her knees, but her right one is worse. She has chronic knee pain particularly with activity such as hiking. Her pain started getting worse in March 2024, but by May she began limping. She felt her knee give out on her in the first week of June and she had swelling from her hip to her ankle. This limping caused her sciatic nerve to start bothering her, but that has gotten better. She feels that her knee is very unstable and weak. She has been wearing a brace which seems to help some.   PERTINENT HISTORY: Unremarkable PAIN:  Are you having pain? Yes: NPRS scale: 6/10 Pain location: right knee Pain description: constant heavy ache Aggravating factors: bending her knee, sitting, driving, prolonged walking, squatting, kneeling Relieving factors: medication, rest, elevating her leg  PRECAUTIONS: None  WEIGHT BEARING RESTRICTIONS: No  FALLS:   Has patient fallen in last 6 months? No  LIVING ENVIRONMENT: Lives with: lives with their family Lives in: House/apartment Stairs: Yes: Internal: 15 steps;   and External: 3-4 steps; step to pattern Has following equipment at home: None  OCCUPATION: teacher  PLOF: Independent  PATIENT GOALS: be able to walk, run, jog, and do all of her daily activities  NEXT MD VISIT: none scheduled  OBJECTIVE:   PATIENT SURVEYS:  FOTO to be completed at first follow-up  COGNITION: Overall cognitive status: Within functional limits for tasks assessed     SENSATION: Patient reports that she has some tingling in her toes on her right side after prolonged walking  PALPATION: TTP: right gastroc/soleus, MCL, LCL, quadriceps, and hamstring (pressure along popliteal fossa reducing familiar pain)   JOINT MOBILITY:  Right patella: hypomobile and painful   Right tibiofemoral : WFL and nonpainful   LOWER EXTREMITY ROM:  Active ROM Right eval Left eval  Hip flexion    Hip extension    Hip abduction    Hip adduction    Hip internal rotation    Hip external rotation    Knee flexion 113; pain begins at 100 degrees 143  Knee extension 9 0  Ankle dorsiflexion    Ankle plantarflexion    Ankle inversion    Ankle eversion     (Blank rows = not tested)  LOWER EXTREMITY MMT: not tested at this time due to symptom irritability  LOWER EXTREMITY SPECIAL  TESTS:  Knee special tests: Anterior drawer test: negative, Posterior drawer test: negative, McMurray's test: positive , and MCL testing: negative; LCL testing: no ligamentous laxity, but significant reproduction in familiar pain  GAIT: Assistive device utilized:  right knee brace Level of assistance: Complete Independence Comments: decreased stance time on RLE, right knee flexed in stance phase, and unable to achieve terminal knee extension   TODAY'S TREATMENT:                                                                                                                               DATE:                                      EXERCISE LOG  Exercise Repetitions and Resistance Comments  Seated HS isometric 15 reps w/ 5 second hold   Seated hip ADD isometric 15 reps w/ 5 second hold   Seated heel slide 20 reps            Blank cell = exercise not performed today   PATIENT EDUCATION:  Education details: HEP, POC, healing, edema, anatomy, prognosis, and goals for therapy Person educated: Patient Education method: Explanation Education comprehension: verbalized understanding  HOME EXERCISE PROGRAM: [id number]  ASSESSMENT:  CLINICAL IMPRESSION: Patient is a 42 y.o. female who was seen today for physical therapy evaluation and treatment for chronic right knee pain with an exacerbation and symptoms in March 2024. She presented with moderate pain severity and high irritability with right knee active range of motion being the most aggravating to her familiar symptoms.  Manual muscle testing was not performed at this time due to her high symptom irritability. She was provided a home exercise program which she was able to properly demonstrate. She reported feeling comfortable with these interventions. Recommend that she continue with skilled physical therapy to address her impairments to return to her prior level of function.  OBJECTIVE IMPAIRMENTS: Abnormal gait, decreased activity tolerance, decreased mobility, difficulty walking, decreased ROM, decreased strength, hypomobility, impaired tone, and pain.   ACTIVITY LIMITATIONS: carrying, lifting, standing, squatting, stairs, transfers, and locomotion level  PARTICIPATION LIMITATIONS: meal prep, cleaning, laundry, driving, shopping, community activity, and yard work  PERSONAL FACTORS: Past/current experiences and Time since onset of injury/illness/exacerbation are also affecting patient's functional outcome.   REHAB POTENTIAL: Good  CLINICAL DECISION MAKING: Evolving/moderate  complexity  EVALUATION COMPLEXITY: Moderate   GOALS: Goals reviewed with patient? Yes  SHORT TERM GOALS: Target date: 06/20/23 Patient will be independent with her initial HEP. Baseline: Goal status: INITIAL  2.  Patient will be able to complete her daily activities without her familiar pain exceeding 4/10. Baseline:  Goal status: INITIAL  3.  Patient will be able to demonstrate at least 120 degrees of right knee flexion for improved function navigating stairs. Baseline:  Goal status: INITIAL  4.  Patient will be able  to demonstrate right knee extension within 5 degrees of neutral for improved gait mechanics. Baseline:  Goal status: INITIAL  LONG TERM GOALS: Target date: 07/11/23  Patient will be independent with her advanced HEP. Baseline:  Goal status: INITIAL  2.  Patient will be able to complete her daily activities without her familiar symptoms exceeding 2/10. Baseline:  Goal status: INITIAL  3.  Patient will be able to ambulate with no significant gait deviations. Baseline:  Goal status: INITIAL  4.  Patient will be able to navigate at least 4 steps with a reciprocal pattern for improved household mobility. Baseline:  Goal status: INITIAL  5.  Patient will report being able to garden without being limited by her familiar right knee symptoms. Baseline:  Goal status: INITIAL  PLAN:  PT FREQUENCY: 1-2x/week  PT DURATION: 6 weeks  PLANNED INTERVENTIONS: Therapeutic exercises, Therapeutic activity, Neuromuscular re-education, Balance training, Gait training, Patient/Family education, Self Care, Joint mobilization, Stair training, Electrical stimulation, Cryotherapy, Moist heat, Taping, Vasopneumatic device, Manual therapy, and Re-evaluation  PLAN FOR NEXT SESSION: NuStep, review and update HEP as needed, lower extremity isometrics, manual therapy, and modalities as needed   Granville Lewis, PT 05/30/2023, 3:35 PM

## 2023-06-04 ENCOUNTER — Ambulatory Visit: Payer: Commercial Managed Care - PPO | Admitting: Physical Therapy

## 2023-06-04 ENCOUNTER — Encounter: Payer: Self-pay | Admitting: Physical Therapy

## 2023-06-04 DIAGNOSIS — R2689 Other abnormalities of gait and mobility: Secondary | ICD-10-CM

## 2023-06-04 DIAGNOSIS — M25561 Pain in right knee: Secondary | ICD-10-CM | POA: Diagnosis not present

## 2023-06-04 DIAGNOSIS — G8929 Other chronic pain: Secondary | ICD-10-CM

## 2023-06-04 NOTE — Therapy (Signed)
OUTPATIENT PHYSICAL THERAPY LOWER EXTREMITY TREATMENT   Patient Name: [name] MRN: [id number] DOB:10-11-1981, 42 y.o., female Today's Date: 06/04/2023  END OF SESSION:  PT End of Session - 06/04/23 1019     Visit Number 2    Number of Visits 10    Date for PT Re-Evaluation 08/24/23    PT Start Time 1018    PT Stop Time 1100    PT Time Calculation (min) 42 min    Activity Tolerance Patient tolerated treatment well    Behavior During Therapy [facility] for tasks assessed/performed            History reviewed. No pertinent past medical history. History reviewed. No pertinent surgical history. There are no problems to display for this patient.  PCP: Juliette Alcide, MD  REFERRING PROVIDER: Teryl Lucy, MD  REFERRING DIAG: Right knee pain  THERAPY DIAG:  Chronic pain of right knee  Other abnormalities of gait and mobility  Rationale for Evaluation and Treatment: Rehabilitation  ONSET DATE: Chronic with exacerbation on March 2024   SUBJECTIVE:   SUBJECTIVE STATEMENT: Has a lot of trouble with knee flexion. Driving also reported as painful.  PERTINENT HISTORY: Unremarkable  PAIN:  Are you having pain? Yes: NPRS scale: 3/10 Pain location: right knee Pain description: constant heavy ache Aggravating factors: bending her knee, sitting, driving, prolonged walking, squatting, kneeling Relieving factors: medication, rest, elevating her leg  PRECAUTIONS: None  WEIGHT BEARING RESTRICTIONS: No  FALLS:  Has patient fallen in last 6 months? No  LIVING ENVIRONMENT: Lives with: lives with their family Lives in: House/apartment Stairs: Yes: Internal: 12 steps;   and External: 3-4 steps; step to pattern Has following equipment at home: None  OCCUPATION: teacher  PLOF: Independent  PATIENT GOALS: be able to walk, run, jog, and do all of her daily activities  NEXT MD VISIT: none scheduled  OBJECTIVE:   PATIENT SURVEYS:  FOTO 56  COGNITION: Overall  cognitive status: Within functional limits for tasks assessed     SENSATION: Patient reports that she has some tingling in her toes on her right side after prolonged walking  PALPATION: TTP: right gastroc/soleus, MCL, LCL, quadriceps, and hamstring (pressure along popliteal fossa reducing familiar pain)   JOINT MOBILITY:  Right patella: hypomobile and painful   Right tibiofemoral : WFL and nonpainful   LOWER EXTREMITY ROM:  Active ROM Right eval Left eval  Hip flexion    Hip extension    Hip abduction    Hip adduction    Hip internal rotation    Hip external rotation    Knee flexion 113; pain begins at 100 degrees 143  Knee extension 9 0  Ankle dorsiflexion    Ankle plantarflexion    Ankle inversion    Ankle eversion     (Blank rows = not tested)  LOWER EXTREMITY MMT: not tested at this time due to symptom irritability  LOWER EXTREMITY SPECIAL TESTS:  Knee special tests: Anterior drawer test: negative, Posterior drawer test: negative, McMurray's test: positive , and MCL testing: negative; LCL testing: no ligamentous laxity, but significant reproduction in familiar pain  GAIT: Assistive device utilized:  right knee brace Level of assistance: Complete Independence Comments: decreased stance time on RLE, right knee flexed in stance phase, and unable to achieve terminal knee extension  TODAY'S TREATMENT:  DATE:  06/04/23 EXERCISE LOG  Exercise Repetitions and Resistance Comments  Nustep L1, seat 8 x12 min   QS X10 reps 5 sec holds   HS set X10 reps 5 sec holds   Heel slides X15 reps   Adductor squeeze X10 reps 5 sec holds   SAQ Small bolster x10 reps 5 sec holds    Blank cell = exercise not performed today   Modalities  Date: 06/04/23 Vaso: Knee, Low, 10 mins, Pain and Edema  PATIENT EDUCATION:  Education details: Added QS with HEP from  evaluation Person educated: Patient Education method: Explanation Education comprehension: verbalized understanding  HOME EXERCISE PROGRAM: [id number]  ASSESSMENT:  CLINICAL IMPRESSION: Patient presented in clinic with reports of mild R knee pain. Patient very cognizant of her limitations with ADLs and pain. Patient very compliant with HEP and use of brace. Patient progressed through light AROM and strengthening exercises with greatest discomfort with knee flexion. Patient is also very active at home with her animals and gardening but makes time to rest and ice as directed. Normal vasopneumatic response noted following removal of the modality.  OBJECTIVE IMPAIRMENTS: Abnormal gait, decreased activity tolerance, decreased mobility, difficulty walking, decreased ROM, decreased strength, hypomobility, impaired tone, and pain.   ACTIVITY LIMITATIONS: carrying, lifting, standing, squatting, stairs, transfers, and locomotion level  PARTICIPATION LIMITATIONS: meal prep, cleaning, laundry, driving, shopping, community activity, and yard work  PERSONAL FACTORS: Past/current experiences and Time since onset of injury/illness/exacerbation are also affecting patient's functional outcome.   REHAB POTENTIAL: Good  CLINICAL DECISION MAKING: Evolving/moderate complexity  EVALUATION COMPLEXITY: Moderate  GOALS: Goals reviewed with patient? Yes  SHORT TERM GOALS: Target date: 06/20/23 Patient will be independent with her initial HEP. Baseline: Goal status: MET  2.  Patient will be able to complete her daily activities without her familiar pain exceeding 4/10. Baseline:  Goal status: On-going  3.  Patient will be able to demonstrate at least 120 degrees of right knee flexion for improved function navigating stairs. Baseline:  Goal status: On-going  4.  Patient will be able to demonstrate right knee extension within 5 degrees of neutral for improved gait mechanics. Baseline:  Goal status:  On-going  LONG TERM GOALS: Target date: 07/11/23  Patient will be independent with her advanced HEP. Baseline:  Goal status: On-going  2.  Patient will be able to complete her daily activities without her familiar symptoms exceeding 2/10. Baseline:  Goal status: On-going  3.  Patient will be able to ambulate with no significant gait deviations. Baseline:  Goal status: On-going  4.  Patient will be able to navigate at least 4 steps with a reciprocal pattern for improved household mobility. Baseline:  Goal status: On-going  5.  Patient will report being able to garden without being limited by her familiar right knee symptoms. Baseline:  Goal status: On-going  PLAN:  PT FREQUENCY: 1-2x/week  PT DURATION: 6 weeks  PLANNED INTERVENTIONS: Therapeutic exercises, Therapeutic activity, Neuromuscular re-education, Balance training, Gait training, Patient/Family education, Self Care, Joint mobilization, Stair training, Electrical stimulation, Cryotherapy, Moist heat, Taping, Vasopneumatic device, Manual therapy, and Re-evaluation  PLAN FOR NEXT SESSION: NuStep, review and update HEP as needed, lower extremity isometrics, manual therapy, and modalities as needed  Marvell Fuller, PTA 06/04/2023, 11:11 AM

## 2023-06-07 ENCOUNTER — Ambulatory Visit: Payer: Commercial Managed Care - PPO

## 2023-06-07 DIAGNOSIS — R2689 Other abnormalities of gait and mobility: Secondary | ICD-10-CM

## 2023-06-07 DIAGNOSIS — G8929 Other chronic pain: Secondary | ICD-10-CM

## 2023-06-07 DIAGNOSIS — M25561 Pain in right knee: Secondary | ICD-10-CM | POA: Diagnosis not present

## 2023-06-07 NOTE — Therapy (Signed)
OUTPATIENT PHYSICAL THERAPY LOWER EXTREMITY TREATMENT   Patient Name: [name] MRN: [id number] DOB:December 11, 1980, 42 y.o., female Today's Date: 06/07/2023  END OF SESSION:  PT End of Session - 06/07/23 0856     Visit Number 3    Number of Visits 10    Date for PT Re-Evaluation 08/24/23    PT Start Time 0851    PT Stop Time 0949    PT Time Calculation (min) 58 min    Activity Tolerance Patient tolerated treatment well    Behavior During Therapy [facility] for tasks assessed/performed             History reviewed. No pertinent past medical history. History reviewed. No pertinent surgical history. There are no problems to display for this patient.  PCP: Juliette Alcide, MD  REFERRING PROVIDER: Teryl Lucy, MD  REFERRING DIAG: Right knee pain  THERAPY DIAG:  Chronic pain of right knee  Other abnormalities of gait and mobility  Rationale for Evaluation and Treatment: Rehabilitation  ONSET DATE: Chronic with exacerbation on March 2024   SUBJECTIVE:   SUBJECTIVE STATEMENT: Patient reports that she was sore after her last appointment, but "not too bad." However, she did go home and elevate and ice her knee when she got home. She notes that she has been doing some of her exercises at home in the pool.   PERTINENT HISTORY: Unremarkable  PAIN:  Are you having pain? Yes: NPRS scale: 2-3/10 Pain location: right knee Pain description: constant heavy ache Aggravating factors: bending her knee, sitting, driving, prolonged walking, squatting, kneeling Relieving factors: medication, rest, elevating her leg  PRECAUTIONS: None  WEIGHT BEARING RESTRICTIONS: No  FALLS:  Has patient fallen in last 6 months? No  LIVING ENVIRONMENT: Lives with: lives with their family Lives in: House/apartment Stairs: Yes: Internal: 12 steps;   and External: 3-4 steps; step to pattern Has following equipment at home: None  OCCUPATION: teacher  PLOF: Independent  PATIENT GOALS: be  able to walk, run, jog, and do all of her daily activities  NEXT MD VISIT: none scheduled  OBJECTIVE:   PATIENT SURVEYS:  FOTO 56  COGNITION: Overall cognitive status: Within functional limits for tasks assessed     SENSATION: Patient reports that she has some tingling in her toes on her right side after prolonged walking  PALPATION: TTP: right gastroc/soleus, MCL, LCL, quadriceps, and hamstring (pressure along popliteal fossa reducing familiar pain)   JOINT MOBILITY:  Right patella: hypomobile and painful   Right tibiofemoral : WFL and nonpainful   LOWER EXTREMITY ROM:  Active ROM Right eval Left eval  Hip flexion    Hip extension    Hip abduction    Hip adduction    Hip internal rotation    Hip external rotation    Knee flexion 113; pain begins at 100 degrees 143  Knee extension 9 0  Ankle dorsiflexion    Ankle plantarflexion    Ankle inversion    Ankle eversion     (Blank rows = not tested)  LOWER EXTREMITY MMT: not tested at this time due to symptom irritability  LOWER EXTREMITY SPECIAL TESTS:  Knee special tests: Anterior drawer test: negative, Posterior drawer test: negative, McMurray's test: positive , and MCL testing: negative; LCL testing: no ligamentous laxity, but significant reproduction in familiar pain  GAIT: Assistive device utilized:  right knee brace Level of assistance: Complete Independence Comments: decreased stance time on RLE, right knee flexed in stance phase, and unable to achieve terminal  knee extension  TODAY'S TREATMENT:                                                                                                                              DATE:                                   06/07/23 EXERCISE LOG  Exercise Repetitions and Resistance Comments  Nustep L3 x 12 minutes; seat 8   LAQ 12 reps  RLE only; partial ROM; patient reported reduced pain and more "comfort" when pressure applied to HS tendons  Gastroc stretch  3 x 30  seconds  RLE only   AA SLR 10 reps w/ 5 second hold        Blank cell = exercise not performed today  Manual Therapy Soft Tissue Mobilization: right hamstring and quadriceps, for reduced pain and tone Joint Mobilizations: right patella, grade I-III   Modalities: no redness or adverse reaction to today's modalities  Date:  Unattended Estim: Right hamstring, pre mod @ 80-150 Hz, 15 mins, Pain and Tone Vaso: Knee, 34 degrees; low pressure, 15 mins, Pain and Tone    06/04/23 EXERCISE LOG  Exercise Repetitions and Resistance Comments  Nustep L1, seat 8 x12 min   QS X10 reps 5 sec holds   HS set X10 reps 5 sec holds   Heel slides X15 reps   Adductor squeeze X10 reps 5 sec holds   SAQ Small bolster x10 reps 5 sec holds    Blank cell = exercise not performed today   Modalities  Date: 06/04/23 Vaso: Knee, Low, 10 mins, Pain and Edema  PATIENT EDUCATION:  Education details: reviewed HEP and electrical stimulation Person educated: Patient Education method: Explanation Education comprehension: verbalized understanding  HOME EXERCISE PROGRAM: [id number]  ASSESSMENT:  CLINICAL IMPRESSION: Patient was introduced to multiple new interventions for reduced pain and improved mobility. She experienced the most difficulty with active assisted straight leg raises. She required minimal cueing with today's new interventions for proper positioning to facilitate proper biomechanics. She experienced a moderate increase in her familiar "aching" with long arc quads and active assisted straight leg raises, but this did not inhibit her ability to complete today's interventions. Manual therapy focused on patellar joint mobilizations and soft tissue mobilization to the hamstrings with minimal effectiveness at reducing her familiar symptoms. She reported that her knee felt "a little better" upon the conclusion of treatment. She continues to require skilled physical therapy to address her remaining impairments to  return to her prior level of function.   OBJECTIVE IMPAIRMENTS: Abnormal gait, decreased activity tolerance, decreased mobility, difficulty walking, decreased ROM, decreased strength, hypomobility, impaired tone, and pain.   ACTIVITY LIMITATIONS: carrying, lifting, standing, squatting, stairs, transfers, and locomotion level  PARTICIPATION LIMITATIONS: meal prep, cleaning, laundry, driving, shopping, community activity, and yard work  PERSONAL FACTORS: Past/current experiences and Time since  onset of injury/illness/exacerbation are also affecting patient's functional outcome.   REHAB POTENTIAL: Good  CLINICAL DECISION MAKING: Evolving/moderate complexity  EVALUATION COMPLEXITY: Moderate  GOALS: Goals reviewed with patient? Yes  SHORT TERM GOALS: Target date: 06/20/23 Patient will be independent with her initial HEP. Baseline: Goal status: MET  2.  Patient will be able to complete her daily activities without her familiar pain exceeding 4/10. Baseline:  Goal status: On-going  3.  Patient will be able to demonstrate at least 120 degrees of right knee flexion for improved function navigating stairs. Baseline:  Goal status: On-going  4.  Patient will be able to demonstrate right knee extension within 5 degrees of neutral for improved gait mechanics. Baseline:  Goal status: On-going  LONG TERM GOALS: Target date: 07/11/23  Patient will be independent with her advanced HEP. Baseline:  Goal status: On-going  2.  Patient will be able to complete her daily activities without her familiar symptoms exceeding 2/10. Baseline:  Goal status: On-going  3.  Patient will be able to ambulate with no significant gait deviations. Baseline:  Goal status: On-going  4.  Patient will be able to navigate at least 4 steps with a reciprocal pattern for improved household mobility. Baseline:  Goal status: On-going  5.  Patient will report being able to garden without being limited by her  familiar right knee symptoms. Baseline:  Goal status: On-going  PLAN:  PT FREQUENCY: 1-2x/week  PT DURATION: 6 weeks  PLANNED INTERVENTIONS: Therapeutic exercises, Therapeutic activity, Neuromuscular re-education, Balance training, Gait training, Patient/Family education, Self Care, Joint mobilization, Stair training, Electrical stimulation, Cryotherapy, Moist heat, Taping, Vasopneumatic device, Manual therapy, and Re-evaluation  PLAN FOR NEXT SESSION: NuStep, review and update HEP as needed, lower extremity isometrics, manual therapy, and modalities as needed  Granville Lewis, PT 06/07/2023, 10:20 AM

## 2023-06-12 ENCOUNTER — Encounter: Payer: Self-pay | Admitting: Physical Therapy

## 2023-06-12 ENCOUNTER — Ambulatory Visit: Payer: Commercial Managed Care - PPO | Admitting: Physical Therapy

## 2023-06-12 DIAGNOSIS — G8929 Other chronic pain: Secondary | ICD-10-CM

## 2023-06-12 DIAGNOSIS — R2689 Other abnormalities of gait and mobility: Secondary | ICD-10-CM

## 2023-06-12 DIAGNOSIS — M25561 Pain in right knee: Secondary | ICD-10-CM | POA: Diagnosis not present

## 2023-06-12 NOTE — Therapy (Signed)
OUTPATIENT PHYSICAL THERAPY LOWER EXTREMITY TREATMENT   Patient Name: [name] MRN: [id number] DOB:Jan 25, 1981, 42 y.o., female Today's Date: 06/12/2023  END OF SESSION:  PT End of Session - 06/12/23 0848     Visit Number 4    Number of Visits 10    Date for PT Re-Evaluation 08/24/23    PT Start Time 0848    PT Stop Time 0933    PT Time Calculation (min) 45 min    Activity Tolerance Patient tolerated treatment well    Behavior During Therapy [facility] for tasks assessed/performed            History reviewed. No pertinent past medical history. History reviewed. No pertinent surgical history. There are no problems to display for this patient.  PCP: Juliette Alcide, MD  REFERRING PROVIDER: Teryl Lucy, MD  REFERRING DIAG: Right knee pain  THERAPY DIAG:  Chronic pain of right knee  Other abnormalities of gait and mobility  Rationale for Evaluation and Treatment: Rehabilitation  ONSET DATE: Chronic with exacerbation on March 2024   SUBJECTIVE:   SUBJECTIVE STATEMENT: Reports that she fell on Saturday when she accidentally fell into a culvert. Had soreness and has iced repeatedly. Patient states that she did not do HEP to allow R knee. States that she thinks she may be walking better and more normal since starting PT.  PERTINENT HISTORY: Unremarkable  PAIN:  Are you having pain? Yes: NPRS scale: 2-3/10 Pain location: right knee Pain description: constant heavy ache, sore Aggravating factors: bending her knee, sitting, driving, prolonged walking, squatting, kneeling Relieving factors: medication, rest, elevating her leg  PRECAUTIONS: None  WEIGHT BEARING RESTRICTIONS: No  FALLS:  Has patient fallen in last 6 months? No  LIVING ENVIRONMENT: Lives with: lives with their family Lives in: House/apartment Stairs: Yes: Internal: 12 steps;   and External: 3-4 steps; step to pattern Has following equipment at home: None  OCCUPATION: teacher  PLOF:  Independent  PATIENT GOALS: be able to walk, run, jog, and do all of her daily activities  NEXT MD VISIT: none scheduled  OBJECTIVE:   PATIENT SURVEYS:  FOTO 56  COGNITION: Overall cognitive status: Within functional limits for tasks assessed     SENSATION: Patient reports that she has some tingling in her toes on her right side after prolonged walking  PALPATION: TTP: right gastroc/soleus, MCL, LCL, quadriceps, and hamstring (pressure along popliteal fossa reducing familiar pain)   JOINT MOBILITY:  Right patella: hypomobile and painful   Right tibiofemoral : WFL and nonpainful   LOWER EXTREMITY ROM:  Active ROM Right eval Left eval  Hip flexion    Hip extension    Hip abduction    Hip adduction    Hip internal rotation    Hip external rotation    Knee flexion 113; pain begins at 100 degrees 143  Knee extension 9 0  Ankle dorsiflexion    Ankle plantarflexion    Ankle inversion    Ankle eversion     (Blank rows = not tested)  LOWER EXTREMITY MMT: not tested at this time due to symptom irritability  LOWER EXTREMITY SPECIAL TESTS:  Knee special tests: Anterior drawer test: negative, Posterior drawer test: negative, McMurray's test: positive , and MCL testing: negative; LCL testing: no ligamentous laxity, but significant reproduction in familiar pain  GAIT: Assistive device utilized:  right knee brace Level of assistance: Complete Independence Comments: decreased stance time on RLE, right knee flexed in stance phase, and unable to achieve terminal  knee extension  TODAY'S TREATMENT:                                                                                                                              DATE:06/12/23 EXERCISE LOG  Exercise Repetitions and Resistance Comments  Nustep L3 x15 min   QS X15 reps 5 sec holds   SAQ X15 reps 5 sec holds   Heel slides X15 reps        Blank cell = exercise not performed today   Modalities  Date: 06/12/23 Unattended  Estim: Knee, IFC, 15 mins, Pain and Edema Vaso: Knee, Low, 15 mins, Pain and Edema                                   06/07/23 EXERCISE LOG  Exercise Repetitions and Resistance Comments  Nustep L3 x 12 minutes; seat 8   LAQ 12 reps  RLE only; partial ROM; patient reported reduced pain and more "comfort" when pressure applied to HS tendons  Gastroc stretch  3 x 30 seconds  RLE only   AA SLR 10 reps w/ 5 second hold        Blank cell = exercise not performed today  Manual Therapy Soft Tissue Mobilization: right hamstring and quadriceps, for reduced pain and tone Joint Mobilizations: right patella, grade I-III   Modalities: no redness or adverse reaction to today's modalities  Date:  Unattended Estim: Right hamstring, pre mod @ 80-150 Hz, 15 mins, Pain and Tone Vaso: Knee, 34 degrees; low pressure, 15 mins, Pain and Tone  PATIENT EDUCATION:  Education details: reviewed HEP and electrical stimulation Person educated: Patient Education method: Explanation Education comprehension: verbalized understanding  HOME EXERCISE PROGRAM: [id number]  ASSESSMENT:  CLINICAL IMPRESSION: Patient presented in clinic with reports of the fall over the weekend. Patient guided through light therex due to soreness and ache of R knee. Patient observed with facial grimacing during light therex from discomfort. Patient encouraged to continue icing and complete HEP as comfortable. Mod R knee edema notable after brace doffed. Normal modalities response noted following removal of the modalities.  OBJECTIVE IMPAIRMENTS: Abnormal gait, decreased activity tolerance, decreased mobility, difficulty walking, decreased ROM, decreased strength, hypomobility, impaired tone, and pain.   ACTIVITY LIMITATIONS: carrying, lifting, standing, squatting, stairs, transfers, and locomotion level  PARTICIPATION LIMITATIONS: meal prep, cleaning, laundry, driving, shopping, community activity, and yard work  PERSONAL FACTORS:  Past/current experiences and Time since onset of injury/illness/exacerbation are also affecting patient's functional outcome.   REHAB POTENTIAL: Good  CLINICAL DECISION MAKING: Evolving/moderate complexity  EVALUATION COMPLEXITY: Moderate  GOALS: Goals reviewed with patient? Yes  SHORT TERM GOALS: Target date: 06/20/23 Patient will be independent with her initial HEP. Baseline: Goal status: MET  2.  Patient will be able to complete her daily activities without her familiar pain exceeding 4/10. Baseline:  Goal status: On-going  3.  Patient  will be able to demonstrate at least 120 degrees of right knee flexion for improved function navigating stairs. Baseline:  Goal status: On-going  4.  Patient will be able to demonstrate right knee extension within 5 degrees of neutral for improved gait mechanics. Baseline:  Goal status: On-going  LONG TERM GOALS: Target date: 07/11/23  Patient will be independent with her advanced HEP. Baseline:  Goal status: On-going  2.  Patient will be able to complete her daily activities without her familiar symptoms exceeding 2/10. Baseline:  Goal status: On-going  3.  Patient will be able to ambulate with no significant gait deviations. Baseline:  Goal status: On-going  4.  Patient will be able to navigate at least 4 steps with a reciprocal pattern for improved household mobility. Baseline:  Goal status: On-going  5.  Patient will report being able to garden without being limited by her familiar right knee symptoms. Baseline:  Goal status: On-going  PLAN:  PT FREQUENCY: 1-2x/week  PT DURATION: 6 weeks  PLANNED INTERVENTIONS: Therapeutic exercises, Therapeutic activity, Neuromuscular re-education, Balance training, Gait training, Patient/Family education, Self Care, Joint mobilization, Stair training, Electrical stimulation, Cryotherapy, Moist heat, Taping, Vasopneumatic device, Manual therapy, and Re-evaluation  PLAN FOR NEXT SESSION:  NuStep, review and update HEP as needed, lower extremity isometrics, manual therapy, and modalities as needed  Marvell Fuller, PTA 06/12/2023, 9:34 AM

## 2023-06-15 ENCOUNTER — Ambulatory Visit: Payer: Commercial Managed Care - PPO

## 2023-06-15 DIAGNOSIS — M25561 Pain in right knee: Secondary | ICD-10-CM | POA: Diagnosis not present

## 2023-06-15 DIAGNOSIS — G8929 Other chronic pain: Secondary | ICD-10-CM

## 2023-06-15 DIAGNOSIS — R2689 Other abnormalities of gait and mobility: Secondary | ICD-10-CM

## 2023-06-15 NOTE — Therapy (Signed)
OUTPATIENT PHYSICAL THERAPY LOWER EXTREMITY TREATMENT   Patient Name: [name] MRN: [id number] DOB:[birth year], 42 y.o., female Today's Date: 06/15/2023  END OF SESSION:  PT End of Session - 06/15/23 0853     Visit Number 5    Number of Visits 10    Date for PT Re-Evaluation 08/24/23    PT Start Time 0850   Patient arrived late to her appointment.   PT Stop Time 0950    PT Time Calculation (min) 60 min    Activity Tolerance Patient tolerated treatment well    Behavior During Therapy [facility] for tasks assessed/performed             History reviewed. No pertinent past medical history. History reviewed. No pertinent surgical history. There are no problems to display for this patient.  PCP: Juliette Alcide, MD  REFERRING PROVIDER: Teryl Lucy, MD  REFERRING DIAG: Right knee pain  THERAPY DIAG:  Chronic pain of right knee  Other abnormalities of gait and mobility  Rationale for Evaluation and Treatment: Rehabilitation  ONSET DATE: Chronic with exacerbation on March 2024   SUBJECTIVE:   SUBJECTIVE STATEMENT: Patient reports that she is hurting a little bit from getting ready for her upcoming trip. She notes that she is walking better now.   PERTINENT HISTORY: Unremarkable  PAIN:  Are you having pain? Yes: NPRS scale: 2/10 Pain location: right knee Pain description: constant heavy ache, sore Aggravating factors: bending her knee, sitting, driving, prolonged walking, squatting, kneeling Relieving factors: medication, rest, elevating her leg  PRECAUTIONS: None  WEIGHT BEARING RESTRICTIONS: No  FALLS:  Has patient fallen in last 6 months? No  LIVING ENVIRONMENT: Lives with: lives with their family Lives in: House/apartment Stairs: Yes: Internal: 12 steps;   and External: 3-4 steps; step to pattern Has following equipment at home: None  OCCUPATION: teacher  PLOF: Independent  PATIENT GOALS: be able to walk, run, jog, and do all of her daily  activities  NEXT MD VISIT: none scheduled  OBJECTIVE:   PATIENT SURVEYS:  FOTO 56  COGNITION: Overall cognitive status: Within functional limits for tasks assessed     SENSATION: Patient reports that she has some tingling in her toes on her right side after prolonged walking  PALPATION: TTP: right gastroc/soleus, MCL, LCL, quadriceps, and hamstring (pressure along popliteal fossa reducing familiar pain)   JOINT MOBILITY:  Right patella: hypomobile and painful   Right tibiofemoral : WFL and nonpainful   LOWER EXTREMITY ROM:  Active ROM Right eval Right 06/15/23 Left eval  Hip flexion     Hip extension     Hip abduction     Hip adduction     Hip internal rotation     Hip external rotation     Knee flexion 113; pain begins at 100 degrees 114 143  Knee extension 9 3 0  Ankle dorsiflexion     Ankle plantarflexion     Ankle inversion     Ankle eversion      (Blank rows = not tested)  LOWER EXTREMITY MMT: not tested at this time due to symptom irritability  LOWER EXTREMITY SPECIAL TESTS:  Knee special tests: Anterior drawer test: negative, Posterior drawer test: negative, McMurray's test: positive , and MCL testing: negative; LCL testing: no ligamentous laxity, but significant reproduction in familiar pain  GAIT: Assistive device utilized:  right knee brace Level of assistance: Complete Independence Comments: decreased stance time on RLE, right knee flexed in stance phase, and unable to achieve  terminal knee extension  TODAY'S TREATMENT:                                                                                                                              DATE:                                   06/15/23 EXERCISE LOG  Exercise Repetitions and Resistance Comments  Nustep  L3 x 15 minutes   Quad set  15 reps w/ 5 second hold   Supine hip ADD isometric 10 reps w/ 5 second hold   Supine HS isometric  15 reps w/ 5 second hold   Supine gastroc stretch  3 x 30 seconds     Blank cell = exercise not performed today  Manual Therapy Soft Tissue Mobilization: right hamstring, for reduced pain and tone   Modalities  Date:  Vaso: Knee, 34 degrees; low pressure, 15 mins, Pain  06/12/23 EXERCISE LOG  Exercise Repetitions and Resistance Comments  Nustep L3 x15 min   QS X15 reps 5 sec holds   SAQ X15 reps 5 sec holds   Heel slides X15 reps        Blank cell = exercise not performed today   Modalities  Date: 06/12/23 Unattended Estim: Knee, IFC, 15 mins, Pain and Edema Vaso: Knee, Low, 15 mins, Pain and Edema                                   06/07/23 EXERCISE LOG  Exercise Repetitions and Resistance Comments  Nustep L3 x 12 minutes; seat 8   LAQ 12 reps  RLE only; partial ROM; patient reported reduced pain and more "comfort" when pressure applied to HS tendons  Gastroc stretch  3 x 30 seconds  RLE only   AA SLR 10 reps w/ 5 second hold        Blank cell = exercise not performed today  Manual Therapy Soft Tissue Mobilization: right hamstring and quadriceps, for reduced pain and tone Joint Mobilizations: right patella, grade I-III   Modalities: no redness or adverse reaction to today's modalities  Date:  Unattended Estim: Right hamstring, pre mod @ 80-150 Hz, 15 mins, Pain and Tone Vaso: Knee, 34 degrees; low pressure, 15 mins, Pain and Tone  PATIENT EDUCATION:  Education details: reviewed HEP and electrical stimulation Person educated: Patient Education method: Explanation Education comprehension: verbalized understanding  HOME EXERCISE PROGRAM: [id number]  ASSESSMENT:  CLINICAL IMPRESSION: Patient was introduced to a supine gastrocnemius stretch for improved soft tissue extensibility needed for improved knee extension. She was able to demonstrate improved knee mobility since her initial evaluation on 05/30/23. Manual therapy focused on soft tissue mobilization for improved soft tissue extensibility and reduced pain with minimal effectiveness.  She reported that her knee felt "about the same"  upon the conclusion of treatment. She continues to require skilled physical therapy to address her remaining impairments to return to her prior level of function.   OBJECTIVE IMPAIRMENTS: Abnormal gait, decreased activity tolerance, decreased mobility, difficulty walking, decreased ROM, decreased strength, hypomobility, impaired tone, and pain.   ACTIVITY LIMITATIONS: carrying, lifting, standing, squatting, stairs, transfers, and locomotion level  PARTICIPATION LIMITATIONS: meal prep, cleaning, laundry, driving, shopping, community activity, and yard work  PERSONAL FACTORS: Past/current experiences and Time since onset of injury/illness/exacerbation are also affecting patient's functional outcome.   REHAB POTENTIAL: Good  CLINICAL DECISION MAKING: Evolving/moderate complexity  EVALUATION COMPLEXITY: Moderate  GOALS: Goals reviewed with patient? Yes  SHORT TERM GOALS: Target date: 06/20/23 Patient will be independent with her initial HEP. Baseline: Goal status: MET  2.  Patient will be able to complete her daily activities without her familiar pain exceeding 4/10. Baseline:  Goal status: On-going  3.  Patient will be able to demonstrate at least 120 degrees of right knee flexion for improved function navigating stairs. Baseline:  Goal status: On-going  4.  Patient will be able to demonstrate right knee extension within 5 degrees of neutral for improved gait mechanics. Baseline:  Goal status: On-going  LONG TERM GOALS: Target date: 07/11/23  Patient will be independent with her advanced HEP. Baseline:  Goal status: On-going  2.  Patient will be able to complete her daily activities without her familiar symptoms exceeding 2/10. Baseline:  Goal status: On-going  3.  Patient will be able to ambulate with no significant gait deviations. Baseline:  Goal status: On-going  4.  Patient will be able to navigate at least 4 steps  with a reciprocal pattern for improved household mobility. Baseline:  Goal status: On-going  5.  Patient will report being able to garden without being limited by her familiar right knee symptoms. Baseline:  Goal status: On-going  PLAN:  PT FREQUENCY: 1-2x/week  PT DURATION: 6 weeks  PLANNED INTERVENTIONS: Therapeutic exercises, Therapeutic activity, Neuromuscular re-education, Balance training, Gait training, Patient/Family education, Self Care, Joint mobilization, Stair training, Electrical stimulation, Cryotherapy, Moist heat, Taping, Vasopneumatic device, Manual therapy, and Re-evaluation  PLAN FOR NEXT SESSION: NuStep, review and update HEP as needed, lower extremity isometrics, manual therapy, and modalities as needed  Granville Lewis, PT 06/15/2023, 10:49 AM

## 2023-06-29 ENCOUNTER — Ambulatory Visit: Payer: Commercial Managed Care - PPO | Attending: Orthopedic Surgery

## 2023-06-29 DIAGNOSIS — M25561 Pain in right knee: Secondary | ICD-10-CM | POA: Diagnosis present

## 2023-06-29 DIAGNOSIS — R2689 Other abnormalities of gait and mobility: Secondary | ICD-10-CM | POA: Insufficient documentation

## 2023-06-29 DIAGNOSIS — G8929 Other chronic pain: Secondary | ICD-10-CM | POA: Diagnosis present

## 2023-06-29 NOTE — Therapy (Signed)
OUTPATIENT PHYSICAL THERAPY LOWER EXTREMITY TREATMENT   Patient Name: [name] MRN: [id number] DOB:11-15-1981, 42 y.o., female Today's Date: 06/29/2023  END OF SESSION:  PT End of Session - 06/29/23 0854     Visit Number 6    Number of Visits 10    Date for PT Re-Evaluation 08/24/23    PT Start Time 0845    PT Stop Time 0939    PT Time Calculation (min) 54 min    Activity Tolerance Patient tolerated treatment well    Behavior During Therapy [facility] for tasks assessed/performed              History reviewed. No pertinent past medical history. History reviewed. No pertinent surgical history. There are no problems to display for this patient.  PCP: Juliette Alcide, MD  REFERRING PROVIDER: Teryl Lucy, MD  REFERRING DIAG: Right knee pain  THERAPY DIAG:  Chronic pain of right knee  Other abnormalities of gait and mobility  Rationale for Evaluation and Treatment: Rehabilitation  ONSET DATE: Chronic with exacerbation on March 2024   SUBJECTIVE:   SUBJECTIVE STATEMENT: Patient reports that her knee is still improving and her walking is getting better. She notes that she has been doing a lot of driving since her last appointment. She has been doing her HEP about 75% of the days she was on vacation. She was able to walk about 1 mile one day and she felt alright doing it. However, she still has some pain when she really bends her knee.   PERTINENT HISTORY: Unremarkable  PAIN:  Are you having pain? Yes: NPRS scale: 1/10 Pain location: right knee Pain description: constant heavy ache, sore Aggravating factors: bending her knee, sitting, driving, prolonged walking, squatting, kneeling Relieving factors: medication, rest, elevating her leg  PRECAUTIONS: None  WEIGHT BEARING RESTRICTIONS: No  FALLS:  Has patient fallen in last 6 months? No  LIVING ENVIRONMENT: Lives with: lives with their family Lives in: House/apartment Stairs: Yes: Internal: 12 steps;    and External: 3-4 steps; step to pattern Has following equipment at home: None  OCCUPATION: teacher  PLOF: Independent  PATIENT GOALS: be able to walk, run, jog, and do all of her daily activities  NEXT MD VISIT: none scheduled  OBJECTIVE:   PATIENT SURVEYS:  FOTO 56  COGNITION: Overall cognitive status: Within functional limits for tasks assessed     SENSATION: Patient reports that she has some tingling in her toes on her right side after prolonged walking  PALPATION: TTP: right gastroc/soleus, MCL, LCL, quadriceps, and hamstring (pressure along popliteal fossa reducing familiar pain)   JOINT MOBILITY:  Right patella: hypomobile and painful   Right tibiofemoral : WFL and nonpainful   LOWER EXTREMITY ROM:  Active ROM Right eval Right 06/15/23 Right 06/29/23 Left eval  Hip flexion      Hip extension      Hip abduction      Hip adduction      Hip internal rotation      Hip external rotation      Knee flexion 113; pain begins at 100 degrees 114 112 (prior to pain) 124 (maximum AROM) 143  Knee extension 9 3 0 0  Ankle dorsiflexion      Ankle plantarflexion      Ankle inversion      Ankle eversion       (Blank rows = not tested)  LOWER EXTREMITY MMT: not tested at this time due to symptom irritability  LOWER EXTREMITY SPECIAL  TESTS:  Knee special tests: Anterior drawer test: negative, Posterior drawer test: negative, McMurray's test: positive , and MCL testing: negative; LCL testing: no ligamentous laxity, but significant reproduction in familiar pain  GAIT: Assistive device utilized:  right knee brace Level of assistance: Complete Independence Comments: decreased stance time on RLE, right knee flexed in stance phase, and unable to achieve terminal knee extension  TODAY'S TREATMENT:                                                                                                                              DATE:                                   06/29/23 EXERCISE  LOG  Exercise Repetitions and Resistance Comments  Nustep  L3 x 16 minutes   Bridge  15 reps    SL R hip ABD  15 reps   SL R hip flexion and extension 10 reps   LAQ 15 reps    Lunges onto step  10 reps each   Tandem balance 3 x 30 seconds    Blank cell = exercise not performed today                                    06/15/23 EXERCISE LOG  Exercise Repetitions and Resistance Comments  Nustep  L3 x 15 minutes   Quad set  15 reps w/ 5 second hold   Supine hip ADD isometric 10 reps w/ 5 second hold   Supine HS isometric  15 reps w/ 5 second hold   Supine gastroc stretch  3 x 30 seconds    Blank cell = exercise not performed today  Manual Therapy Soft Tissue Mobilization: right hamstring, for reduced pain and tone   Modalities  Date:  Vaso: Knee, 34 degrees; low pressure, 15 mins, Pain  06/12/23 EXERCISE LOG  Exercise Repetitions and Resistance Comments  Nustep L3 x15 min   QS X15 reps 5 sec holds   SAQ X15 reps 5 sec holds   Heel slides X15 reps        Blank cell = exercise not performed today   Modalities  Date: 06/12/23 Unattended Estim: Knee, IFC, 15 mins, Pain and Edema Vaso: Knee, Low, 15 mins, Pain and Edema  PATIENT EDUCATION:  Education details: progress with therapy, POC, HEP (modification and progression), and objective measures Person educated: Patient Education method: Explanation Education comprehension: verbalized understanding  HOME EXERCISE PROGRAM: [id number] on 06/29/23  ASSESSMENT:  CLINICAL IMPRESSION: Patient was introduced to multiple new side lying and standing interventions for improved hip abductor strength and lower extremity stability. She required minimal verbal and tactile cueing with side lying hip abduction to prevent hip flexion to isolate hip abductor engagement. She experienced a mild increase in her  familiar symptoms with today's interventions. However, it did not prevent her from completing any of today's interventions. She  reported that her knee was "aching a little more" upon the conclusion of treatment. She continues to require skilled physical therapy to address her remaining impairments to return to her prior level of function.   OBJECTIVE IMPAIRMENTS: Abnormal gait, decreased activity tolerance, decreased mobility, difficulty walking, decreased ROM, decreased strength, hypomobility, impaired tone, and pain.   ACTIVITY LIMITATIONS: carrying, lifting, standing, squatting, stairs, transfers, and locomotion level  PARTICIPATION LIMITATIONS: meal prep, cleaning, laundry, driving, shopping, community activity, and yard work  PERSONAL FACTORS: Past/current experiences and Time since onset of injury/illness/exacerbation are also affecting patient's functional outcome.   REHAB POTENTIAL: Good  CLINICAL DECISION MAKING: Evolving/moderate complexity  EVALUATION COMPLEXITY: Moderate  GOALS: Goals reviewed with patient? Yes  SHORT TERM GOALS: Target date: 06/20/23 Patient will be independent with her initial HEP. Baseline: Goal status: MET  2.  Patient will be able to complete her daily activities without her familiar pain exceeding 4/10. Baseline:  Goal status: On-going  3.  Patient will be able to demonstrate at least 120 degrees of right knee flexion for improved function navigating stairs. Baseline:  Goal status: MET  4.  Patient will be able to demonstrate right knee extension within 5 degrees of neutral for improved gait mechanics. Baseline:  Goal status: MET  LONG TERM GOALS: Target date: 07/11/23  Patient will be independent with her advanced HEP. Baseline:  Goal status: On-going  2.  Patient will be able to complete her daily activities without her familiar symptoms exceeding 2/10. Baseline:  Goal status: On-going  3.  Patient will be able to ambulate with no significant gait deviations. Baseline:  Goal status: MET  4.  Patient will be able to navigate at least 4 steps with a reciprocal  pattern for improved household mobility. Baseline: varies depending on the step  Goal status: On-going  5.  Patient will report being able to garden without being limited by her familiar right knee symptoms. Baseline:  Goal status: On-going  PLAN:  PT FREQUENCY: 1-2x/week  PT DURATION: 6 weeks  PLANNED INTERVENTIONS: Therapeutic exercises, Therapeutic activity, Neuromuscular re-education, Balance training, Gait training, Patient/Family education, Self Care, Joint mobilization, Stair training, Electrical stimulation, Cryotherapy, Moist heat, Taping, Vasopneumatic device, Manual therapy, and Re-evaluation  PLAN FOR NEXT SESSION: NuStep, review and update HEP as needed, lower extremity isometrics, manual therapy, and modalities as needed  Granville Lewis, PT 06/29/2023, 9:57 AM

## 2023-07-03 ENCOUNTER — Ambulatory Visit: Payer: Commercial Managed Care - PPO

## 2023-07-03 DIAGNOSIS — R2689 Other abnormalities of gait and mobility: Secondary | ICD-10-CM

## 2023-07-03 DIAGNOSIS — M25561 Pain in right knee: Secondary | ICD-10-CM | POA: Diagnosis not present

## 2023-07-03 DIAGNOSIS — G8929 Other chronic pain: Secondary | ICD-10-CM

## 2023-07-03 NOTE — Therapy (Signed)
OUTPATIENT PHYSICAL THERAPY LOWER EXTREMITY TREATMENT   Patient Name: [name] MRN: [id number] DOB:[birth year], 42 y.o., female Today's Date: 07/03/2023  END OF SESSION:  PT End of Session - 07/03/23 0850     Visit Number 7    Number of Visits 10    Date for PT Re-Evaluation 08/24/23    PT Start Time 0847    PT Stop Time 0930    PT Time Calculation (min) 43 min    Activity Tolerance Patient tolerated treatment well    Behavior During Therapy [facility] for tasks assessed/performed               History reviewed. No pertinent past medical history. History reviewed. No pertinent surgical history. There are no problems to display for this patient.  PCP: Juliette Alcide, MD  REFERRING PROVIDER: Teryl Lucy, MD  REFERRING DIAG: Right knee pain  THERAPY DIAG:  Chronic pain of right knee  Other abnormalities of gait and mobility  Rationale for Evaluation and Treatment: Rehabilitation  ONSET DATE: Chronic with exacerbation on March 2024   SUBJECTIVE:   SUBJECTIVE STATEMENT: Patient reports that her HEP "kicked my butt" as she was tired and sore after doing these exercises. However, they did not hurt. She notes that her knee is a little tight and sore today.   PERTINENT HISTORY: Unremarkable  PAIN:  Are you having pain? Yes: NPRS scale: 1-2/10 Pain location: right knee Pain description: constant heavy ache, sore Aggravating factors: bending her knee, sitting, driving, prolonged walking, squatting, kneeling Relieving factors: medication, rest, elevating her leg  PRECAUTIONS: None  WEIGHT BEARING RESTRICTIONS: No  FALLS:  Has patient fallen in last 6 months? No  LIVING ENVIRONMENT: Lives with: lives with their family Lives in: House/apartment Stairs: Yes: Internal: 12 steps;   and External: 3-4 steps; step to pattern Has following equipment at home: None  OCCUPATION: teacher  PLOF: Independent  PATIENT GOALS: be able to walk, run, jog, and do all of  her daily activities  NEXT MD VISIT: none scheduled  OBJECTIVE:   PATIENT SURVEYS:  FOTO 61.32 on 07/03/23  COGNITION: Overall cognitive status: Within functional limits for tasks assessed     SENSATION: Patient reports that she has some tingling in her toes on her right side after prolonged walking  PALPATION: TTP: right gastroc/soleus, MCL, LCL, quadriceps, and hamstring (pressure along popliteal fossa reducing familiar pain)   JOINT MOBILITY:  Right patella: hypomobile and painful   Right tibiofemoral : WFL and nonpainful   LOWER EXTREMITY ROM:  Active ROM Right eval Right 06/15/23 Right 06/29/23 Left eval  Hip flexion      Hip extension      Hip abduction      Hip adduction      Hip internal rotation      Hip external rotation      Knee flexion 113; pain begins at 100 degrees 114 112 (prior to pain) 124 (maximum AROM) 143  Knee extension 9 3 0 0  Ankle dorsiflexion      Ankle plantarflexion      Ankle inversion      Ankle eversion       (Blank rows = not tested)  LOWER EXTREMITY MMT: not tested at this time due to symptom irritability  LOWER EXTREMITY SPECIAL TESTS:  Knee special tests: Anterior drawer test: negative, Posterior drawer test: negative, McMurray's test: positive , and MCL testing: negative; LCL testing: no ligamentous laxity, but significant reproduction in familiar pain  GAIT:  Assistive device utilized:  right knee brace Level of assistance: Complete Independence Comments: decreased stance time on RLE, right knee flexed in stance phase, and unable to achieve terminal knee extension  TODAY'S TREATMENT:                                                                                                                              DATE:                                   07/03/23 EXERCISE LOG  Exercise Repetitions and Resistance Comments  Nustep  L3-5 x 15 minutes   Tandem balance  2 x 30 seconds each    Sit to stand  10 reps  With increased demand on  RLE   Seated HS stretch  3 x 30 seconds  RLE only   Wall squat 15 reps    Standing HS curl  15 reps  RLE only   Standing hip ABD 15 reps each   Standing hip flexion and extension 25 reps each  RLE only    Blank cell = exercise not performed today                                    06/29/23 EXERCISE LOG  Exercise Repetitions and Resistance Comments  Nustep  L3 x 16 minutes   Bridge  15 reps    SL R hip ABD  15 reps   SL R hip flexion and extension 10 reps   LAQ 15 reps    Lunges onto step  10 reps each   Tandem balance 3 x 30 seconds    Blank cell = exercise not performed today                                    06/15/23 EXERCISE LOG  Exercise Repetitions and Resistance Comments  Nustep  L3 x 15 minutes   Quad set  15 reps w/ 5 second hold   Supine hip ADD isometric 10 reps w/ 5 second hold   Supine HS isometric  15 reps w/ 5 second hold   Supine gastroc stretch  3 x 30 seconds    Blank cell = exercise not performed today  Manual Therapy Soft Tissue Mobilization: right hamstring, for reduced pain and tone   Modalities  Date:  Vaso: Knee, 34 degrees; low pressure, 15 mins, Pain  PATIENT EDUCATION:  Education details: progress with therapy, POC, HEP (modification and progression), and objective measures Person educated: Patient Education method: Explanation Education comprehension: verbalized understanding  HOME EXERCISE PROGRAM: [id number] on 06/29/23  ASSESSMENT:  CLINICAL IMPRESSION: Patient was progressed with multiple standing interventions for improved function with her daily activities. She required minimal cueing  with standing hip flexion, abduction, and extension to prevent trunk mobility to isolate hip mobility. She experienced no significant increase in pain or discomfort with any of today's interventions. She reported that her legs were "sore" upon the conclusion of treatment. She continues to require skilled physical therapy to address her remaining  impairments to return to her prior level of function.   OBJECTIVE IMPAIRMENTS: Abnormal gait, decreased activity tolerance, decreased mobility, difficulty walking, decreased ROM, decreased strength, hypomobility, impaired tone, and pain.   ACTIVITY LIMITATIONS: carrying, lifting, standing, squatting, stairs, transfers, and locomotion level  PARTICIPATION LIMITATIONS: meal prep, cleaning, laundry, driving, shopping, community activity, and yard work  PERSONAL FACTORS: Past/current experiences and Time since onset of injury/illness/exacerbation are also affecting patient's functional outcome.   REHAB POTENTIAL: Good  CLINICAL DECISION MAKING: Evolving/moderate complexity  EVALUATION COMPLEXITY: Moderate  GOALS: Goals reviewed with patient? Yes  SHORT TERM GOALS: Target date: 06/20/23 Patient will be independent with her initial HEP. Baseline: Goal status: MET  2.  Patient will be able to complete her daily activities without her familiar pain exceeding 4/10. Baseline:  Goal status: On-going  3.  Patient will be able to demonstrate at least 120 degrees of right knee flexion for improved function navigating stairs. Baseline:  Goal status: MET  4.  Patient will be able to demonstrate right knee extension within 5 degrees of neutral for improved gait mechanics. Baseline:  Goal status: MET  LONG TERM GOALS: Target date: 07/11/23  Patient will be independent with her advanced HEP. Baseline:  Goal status: On-going  2.  Patient will be able to complete her daily activities without her familiar symptoms exceeding 2/10. Baseline:  Goal status: On-going  3.  Patient will be able to ambulate with no significant gait deviations. Baseline:  Goal status: MET  4.  Patient will be able to navigate at least 4 steps with a reciprocal pattern for improved household mobility. Baseline: varies depending on the step  Goal status: On-going  5.  Patient will report being able to garden  without being limited by her familiar right knee symptoms. Baseline:  Goal status: On-going  PLAN:  PT FREQUENCY: 1-2x/week  PT DURATION: 6 weeks  PLANNED INTERVENTIONS: Therapeutic exercises, Therapeutic activity, Neuromuscular re-education, Balance training, Gait training, Patient/Family education, Self Care, Joint mobilization, Stair training, Electrical stimulation, Cryotherapy, Moist heat, Taping, Vasopneumatic device, Manual therapy, and Re-evaluation  PLAN FOR NEXT SESSION: NuStep, review and update HEP as needed, lower extremity isometrics, manual therapy, and modalities as needed  Granville Lewis, PT 07/03/2023, 11:14 AM

## 2023-07-06 ENCOUNTER — Ambulatory Visit: Payer: Commercial Managed Care - PPO

## 2023-07-06 DIAGNOSIS — M25561 Pain in right knee: Secondary | ICD-10-CM | POA: Diagnosis not present

## 2023-07-06 DIAGNOSIS — G8929 Other chronic pain: Secondary | ICD-10-CM

## 2023-07-06 DIAGNOSIS — R2689 Other abnormalities of gait and mobility: Secondary | ICD-10-CM

## 2023-07-06 NOTE — Therapy (Signed)
OUTPATIENT PHYSICAL THERAPY LOWER EXTREMITY TREATMENT   Patient Name: [name] MRN: [id number] DOB:[birth year], 42 y.o., female Today's Date: 07/06/2023  END OF SESSION:  PT End of Session - 07/06/23 0850     Visit Number 8    Number of Visits 10    Date for PT Re-Evaluation 08/24/23    PT Start Time 0847    PT Stop Time 0938    PT Time Calculation (min) 51 min    Activity Tolerance Patient tolerated treatment well    Behavior During Therapy [facility] for tasks assessed/performed                History reviewed. No pertinent past medical history. History reviewed. No pertinent surgical history. There are no problems to display for this patient.  PCP: Juliette Alcide, MD  REFERRING PROVIDER: Teryl Lucy, MD  REFERRING DIAG: Right knee pain  THERAPY DIAG:  Chronic pain of right knee  Other abnormalities of gait and mobility  Rationale for Evaluation and Treatment: Rehabilitation  ONSET DATE: Chronic with exacerbation on March 2024   SUBJECTIVE:   SUBJECTIVE STATEMENT: Patient reports that her knee is hurting more the past few days, but she feels like she has been overdoing it. She notes that it was bad enough that she had to take her pain medication.   PERTINENT HISTORY: Unremarkable  PAIN:  Are you having pain? Yes: NPRS scale: 2-3/10 Pain location: right knee Pain description: constant heavy ache, sore Aggravating factors: bending her knee, sitting, driving, prolonged walking, squatting, kneeling Relieving factors: medication, rest, elevating her leg  PRECAUTIONS: None  WEIGHT BEARING RESTRICTIONS: No  FALLS:  Has patient fallen in last 6 months? No  LIVING ENVIRONMENT: Lives with: lives with their family Lives in: House/apartment Stairs: Yes: Internal: 12 steps;   and External: 3-4 steps; step to pattern Has following equipment at home: None  OCCUPATION: teacher  PLOF: Independent  PATIENT GOALS: be able to walk, run, jog, and do all  of her daily activities  NEXT MD VISIT: none scheduled  OBJECTIVE:   PATIENT SURVEYS:  FOTO 61.32 on 07/03/23  COGNITION: Overall cognitive status: Within functional limits for tasks assessed     SENSATION: Patient reports that she has some tingling in her toes on her right side after prolonged walking  PALPATION: TTP: right gastroc/soleus, MCL, LCL, quadriceps, and hamstring (pressure along popliteal fossa reducing familiar pain)   JOINT MOBILITY:  Right patella: hypomobile and painful   Right tibiofemoral : WFL and nonpainful   LOWER EXTREMITY ROM:  Active ROM Right eval Right 06/15/23 Right 06/29/23 Left eval  Hip flexion      Hip extension      Hip abduction      Hip adduction      Hip internal rotation      Hip external rotation      Knee flexion 113; pain begins at 100 degrees 114 112 (prior to pain) 124 (maximum AROM) 143  Knee extension 9 3 0 0  Ankle dorsiflexion      Ankle plantarflexion      Ankle inversion      Ankle eversion       (Blank rows = not tested)  LOWER EXTREMITY MMT: not tested at this time due to symptom irritability  LOWER EXTREMITY SPECIAL TESTS:  Knee special tests: Anterior drawer test: negative, Posterior drawer test: negative, McMurray's test: positive , and MCL testing: negative; LCL testing: no ligamentous laxity, but significant reproduction in familiar pain  GAIT: Assistive device utilized:  right knee brace Level of assistance: Complete Independence Comments: decreased stance time on RLE, right knee flexed in stance phase, and unable to achieve terminal knee extension  TODAY'S TREATMENT:                                                                                                                              DATE:                                   07/06/23 EXERCISE LOG  Exercise Repetitions and Resistance Comments  Nustep  L4 x 16 minutes   LAQ 3# x 2 x 5 reps  RLE only   Standing hip flexion  3# x 10 reps  RLE only   Standing  HS curl  3# x 2 x 10 reps  RLE only  Step up and over  6" step x 10 reps each    Eccentric heel tap 6" step x 10 reps  Limited by pain   Static stance on BOSU Ball down x 3 minutes  No UE support    Blank cell = exercise not performed today  Manual Therapy Manual Traction: tibiofemoral, distraction with light PROM    Modalities  Date:  Vaso: Knee, 34 degrees; low pressure, 10 mins, Pain                                   07/03/23 EXERCISE LOG  Exercise Repetitions and Resistance Comments  Nustep  L3-5 x 15 minutes   Tandem balance  2 x 30 seconds each    Sit to stand  10 reps  With increased demand on RLE   Seated HS stretch  3 x 30 seconds  RLE only   Wall squat 15 reps    Standing HS curl  15 reps  RLE only   Standing hip ABD 15 reps each   Standing hip flexion and extension 25 reps each  RLE only    Blank cell = exercise not performed today                                    06/29/23 EXERCISE LOG  Exercise Repetitions and Resistance Comments  Nustep  L3 x 16 minutes   Bridge  15 reps    SL R hip ABD  15 reps   SL R hip flexion and extension 10 reps   LAQ 15 reps    Lunges onto step  10 reps each   Tandem balance 3 x 30 seconds    Blank cell = exercise not performed today   PATIENT EDUCATION:  Education details: progress with therapy, POC, HEP (modification and progression), and objective measures Person educated: Patient Education method:  Explanation Education comprehension: verbalized understanding  HOME EXERCISE PROGRAM: [id number] on 06/29/23  ASSESSMENT:  CLINICAL IMPRESSION: Patient presented to treatment reporting increased right knee pain secondary to overuse. She was able to be introduced to weighted hip flexion, long arc quads, and hamstring curls with moderate difficulty. She experienced a moderate increase in pain with eccentric heel taps. Tibiofemoral distraction was attempted, but this was unable to significantly reduce her familiar symptoms. She  reported that her knee felt a little better upon the conclusion of treatment. She continues to require skilled physical therapy to address her remaining impairments to return to her prior level of function.   OBJECTIVE IMPAIRMENTS: Abnormal gait, decreased activity tolerance, decreased mobility, difficulty walking, decreased ROM, decreased strength, hypomobility, impaired tone, and pain.   ACTIVITY LIMITATIONS: carrying, lifting, standing, squatting, stairs, transfers, and locomotion level  PARTICIPATION LIMITATIONS: meal prep, cleaning, laundry, driving, shopping, community activity, and yard work  PERSONAL FACTORS: Past/current experiences and Time since onset of injury/illness/exacerbation are also affecting patient's functional outcome.   REHAB POTENTIAL: Good  CLINICAL DECISION MAKING: Evolving/moderate complexity  EVALUATION COMPLEXITY: Moderate  GOALS: Goals reviewed with patient? Yes  SHORT TERM GOALS: Target date: 06/20/23 Patient will be independent with her initial HEP. Baseline: Goal status: MET  2.  Patient will be able to complete her daily activities without her familiar pain exceeding 4/10. Baseline:  Goal status: On-going  3.  Patient will be able to demonstrate at least 120 degrees of right knee flexion for improved function navigating stairs. Baseline:  Goal status: MET  4.  Patient will be able to demonstrate right knee extension within 5 degrees of neutral for improved gait mechanics. Baseline:  Goal status: MET  LONG TERM GOALS: Target date: 07/11/23  Patient will be independent with her advanced HEP. Baseline:  Goal status: On-going  2.  Patient will be able to complete her daily activities without her familiar symptoms exceeding 2/10. Baseline:  Goal status: On-going  3.  Patient will be able to ambulate with no significant gait deviations. Baseline:  Goal status: MET  4.  Patient will be able to navigate at least 4 steps with a reciprocal  pattern for improved household mobility. Baseline: varies depending on the step  Goal status: On-going  5.  Patient will report being able to garden without being limited by her familiar right knee symptoms. Baseline:  Goal status: On-going  PLAN:  PT FREQUENCY: 1-2x/week  PT DURATION: 6 weeks  PLANNED INTERVENTIONS: Therapeutic exercises, Therapeutic activity, Neuromuscular re-education, Balance training, Gait training, Patient/Family education, Self Care, Joint mobilization, Stair training, Electrical stimulation, Cryotherapy, Moist heat, Taping, Vasopneumatic device, Manual therapy, and Re-evaluation  PLAN FOR NEXT SESSION: NuStep, review and update HEP as needed, lower extremity isometrics, manual therapy, and modalities as needed  Granville Lewis, PT 07/06/2023, 10:50 AM

## 2023-07-13 ENCOUNTER — Encounter: Payer: Self-pay | Admitting: Physical Therapy

## 2023-07-13 ENCOUNTER — Ambulatory Visit: Payer: Commercial Managed Care - PPO | Admitting: Physical Therapy

## 2023-07-13 DIAGNOSIS — R2689 Other abnormalities of gait and mobility: Secondary | ICD-10-CM

## 2023-07-13 DIAGNOSIS — G8929 Other chronic pain: Secondary | ICD-10-CM

## 2023-07-13 DIAGNOSIS — M25561 Pain in right knee: Secondary | ICD-10-CM | POA: Diagnosis not present

## 2023-07-13 NOTE — Therapy (Signed)
OUTPATIENT PHYSICAL THERAPY LOWER EXTREMITY TREATMENT   Patient Name: [name] MRN: [id number] DOB:[birth year], 42 y.o., female Today's Date: 07/13/2023  END OF SESSION:  PT End of Session - 07/13/23 0853     Visit Number 9    Number of Visits 10    Date for PT Re-Evaluation 08/24/23    PT Start Time 0845    Activity Tolerance Patient tolerated treatment well    Behavior During Therapy [facility] for tasks assessed/performed            History reviewed. No pertinent past medical history. History reviewed. No pertinent surgical history. There are no problems to display for this patient.  PCP: Juliette Alcide, MD  REFERRING PROVIDER: Teryl Lucy, MD  REFERRING DIAG: Right knee pain  THERAPY DIAG:  Chronic pain of right knee  Other abnormalities of gait and mobility  Rationale for Evaluation and Treatment: Rehabilitation  ONSET DATE: Chronic with exacerbation on March 2024   SUBJECTIVE:   SUBJECTIVE STATEMENT: Reports she has minimal pain today but states that she has been stretching her leg more.  PERTINENT HISTORY: Unremarkable  PAIN:  Are you having pain? Yes: NPRS scale: 2-3/10 Pain location: right knee Pain description: constant heavy ache, sore Aggravating factors: bending her knee, sitting, driving, prolonged walking, squatting, kneeling Relieving factors: medication, rest, elevating her leg  PRECAUTIONS: None  WEIGHT BEARING RESTRICTIONS: No  FALLS:  Has patient fallen in last 6 months? No  LIVING ENVIRONMENT: Lives with: lives with their family Lives in: House/apartment Stairs: Yes: Internal: 12 steps;   and External: 3-4 steps; step to pattern Has following equipment at home: None  OCCUPATION: teacher  PLOF: Independent  PATIENT GOALS: be able to walk, run, jog, and do all of her daily activities  NEXT MD VISIT: none scheduled  OBJECTIVE:   PATIENT SURVEYS:  FOTO 61.32 on 07/03/23  COGNITION: Overall cognitive status: Within  functional limits for tasks assessed     SENSATION: Patient reports that she has some tingling in her toes on her right side after prolonged walking  PALPATION: TTP: right gastroc/soleus, MCL, LCL, quadriceps, and hamstring (pressure along popliteal fossa reducing familiar pain)   JOINT MOBILITY:  Right patella: hypomobile and painful   Right tibiofemoral : WFL and nonpainful   LOWER EXTREMITY ROM:  Active ROM Right eval Right 06/15/23 Right 06/29/23 Left eval  Hip flexion      Hip extension      Hip abduction      Hip adduction      Hip internal rotation      Hip external rotation      Knee flexion 113; pain begins at 100 degrees 114 112 (prior to pain) 124 (maximum AROM) 143  Knee extension 9 3 0 0  Ankle dorsiflexion      Ankle plantarflexion      Ankle inversion      Ankle eversion       (Blank rows = not tested)  LOWER EXTREMITY MMT: not tested at this time due to symptom irritability  LOWER EXTREMITY SPECIAL TESTS:  Knee special tests: Anterior drawer test: negative, Posterior drawer test: negative, McMurray's test: positive , and MCL testing: negative; LCL testing: no ligamentous laxity, but significant reproduction in familiar pain  GAIT: Assistive device utilized:  right knee brace Level of assistance: Complete Independence Comments: decreased stance time on RLE, right knee flexed in stance phase, and unable to achieve terminal knee extension  TODAY'S TREATMENT:  DATE:07/13/23 EXERCISE LOG  Exercise Repetitions and Resistance Comments  Nustep  L4 x 16 minutes   LAQ 4# x x12 reps RLE only   Standing hip flexion with knee extension 4# x 15 reps  RLE only   Standing HS curl  4# x15 reps  RLE only  Step down 4" step x15 reps   R LAQ 5# x15 reps        Blank cell = exercise not performed today   Modalities  Date: 07/13/23 Vaso: Knee, 34  degrees; low pressure, 10 mins, Pain  PATIENT EDUCATION:  Education details: progress with therapy, POC, HEP (modification and progression), and objective measures Person educated: Patient Education method: Explanation Education comprehension: verbalized understanding  HOME EXERCISE PROGRAM: [id number] on 06/29/23  ASSESSMENT:  CLINICAL IMPRESSION: Patient presented in clinic with mild R knee pain. Patient states that she feels like the internal knee pain is still there. Patient is still very cautious when it comes to gait and out in the yard. Patient able to tolerate strengthening session well although still guarded due to pain. Normal vasopneumatic response noted following removal of the modality.  OBJECTIVE IMPAIRMENTS: Abnormal gait, decreased activity tolerance, decreased mobility, difficulty walking, decreased ROM, decreased strength, hypomobility, impaired tone, and pain.   ACTIVITY LIMITATIONS: carrying, lifting, standing, squatting, stairs, transfers, and locomotion level  PARTICIPATION LIMITATIONS: meal prep, cleaning, laundry, driving, shopping, community activity, and yard work  PERSONAL FACTORS: Past/current experiences and Time since onset of injury/illness/exacerbation are also affecting patient's functional outcome.   REHAB POTENTIAL: Good  CLINICAL DECISION MAKING: Evolving/moderate complexity  EVALUATION COMPLEXITY: Moderate  GOALS: Goals reviewed with patient? Yes  SHORT TERM GOALS: Target date: 06/20/23 Patient will be independent with her initial HEP. Baseline: Goal status: MET  2.  Patient will be able to complete her daily activities without her familiar pain exceeding 4/10. Baseline:  Goal status: On-going  3.  Patient will be able to demonstrate at least 120 degrees of right knee flexion for improved function navigating stairs. Baseline:  Goal status: MET  4.  Patient will be able to demonstrate right knee extension within 5 degrees of  neutral for improved gait mechanics. Baseline:  Goal status: MET  LONG TERM GOALS: Target date: 07/11/23  Patient will be independent with her advanced HEP. Baseline:  Goal status: On-going  2.  Patient will be able to complete her daily activities without her familiar symptoms exceeding 2/10. Baseline:  Goal status: On-going  3.  Patient will be able to ambulate with no significant gait deviations. Baseline:  Goal status: MET  4.  Patient will be able to navigate at least 4 steps with a reciprocal pattern for improved household mobility. Baseline: varies depending on the step  Goal status: On-going  5.  Patient will report being able to garden without being limited by her familiar right knee symptoms. Baseline:  Goal status: On-going  PLAN:  PT FREQUENCY: 1-2x/week  PT DURATION: 6 weeks  PLANNED INTERVENTIONS: Therapeutic exercises, Therapeutic activity, Neuromuscular re-education, Balance training, Gait training, Patient/Family education, Self Care, Joint mobilization, Stair training, Electrical stimulation, Cryotherapy, Moist heat, Taping, Vasopneumatic device, Manual therapy, and Re-evaluation  PLAN FOR NEXT SESSION: NuStep, review and update HEP as needed, lower extremity isometrics, manual therapy, and modalities as needed  Marvell Fuller, PTA 07/13/2023, 9:04 AM

## 2023-07-20 ENCOUNTER — Ambulatory Visit: Payer: Commercial Managed Care - PPO

## 2023-07-20 DIAGNOSIS — M25561 Pain in right knee: Secondary | ICD-10-CM | POA: Diagnosis not present

## 2023-07-20 DIAGNOSIS — R2689 Other abnormalities of gait and mobility: Secondary | ICD-10-CM

## 2023-07-20 DIAGNOSIS — G8929 Other chronic pain: Secondary | ICD-10-CM

## 2023-07-20 NOTE — Therapy (Signed)
OUTPATIENT PHYSICAL THERAPY LOWER EXTREMITY TREATMENT   Patient Name: [name] MRN: [id number] DOB:05-12-1981, 42 y.o., female Today's Date: 07/20/2023  END OF SESSION:  PT End of Session - 07/20/23 0846     Visit Number 10    Number of Visits 10    Date for PT Re-Evaluation 08/24/23    PT Start Time 0845    PT Stop Time 0930    PT Time Calculation (min) 45 min    Activity Tolerance Patient tolerated treatment well    Behavior During Therapy [facility] for tasks assessed/performed             History reviewed. No pertinent past medical history. History reviewed. No pertinent surgical history. There are no problems to display for this patient.  PCP: Juliette Alcide, MD  REFERRING PROVIDER: Teryl Lucy, MD  REFERRING DIAG: Right knee pain  THERAPY DIAG:  Chronic pain of right knee  Other abnormalities of gait and mobility  Rationale for Evaluation and Treatment: Rehabilitation  ONSET DATE: Chronic with exacerbation on March 2024   SUBJECTIVE:   SUBJECTIVE STATEMENT: Patient reports that her knee is better. She notes that she still has some pain, but the muscles around her knee is better. She feels that she is about 80% better. However, going up hills still bothers her knee.   PERTINENT HISTORY: Unremarkable  PAIN:  Are you having pain? Yes: NPRS scale: 2-3/10 Pain location: right knee Pain description: constant heavy ache, sore Aggravating factors: bending her knee, sitting, driving, prolonged walking, squatting, kneeling Relieving factors: medication, rest, elevating her leg  PRECAUTIONS: None  WEIGHT BEARING RESTRICTIONS: No  FALLS:  Has patient fallen in last 6 months? No  LIVING ENVIRONMENT: Lives with: lives with their family Lives in: House/apartment Stairs: Yes: Internal: 12 steps;   and External: 3-4 steps; step to pattern Has following equipment at home: None  OCCUPATION: teacher  PLOF: Independent  PATIENT GOALS: be able to walk,  run, jog, and do all of her daily activities  NEXT MD VISIT: none scheduled  OBJECTIVE:   PATIENT SURVEYS:  FOTO 61.32 on 07/03/23  COGNITION: Overall cognitive status: Within functional limits for tasks assessed     SENSATION: Patient reports that she has some tingling in her toes on her right side after prolonged walking  PALPATION: TTP: right gastroc/soleus, MCL, LCL, quadriceps, and hamstring (pressure along popliteal fossa reducing familiar pain)   JOINT MOBILITY:  Right patella: hypomobile and painful   Right tibiofemoral : WFL and nonpainful   LOWER EXTREMITY ROM:  Active ROM Right eval Right 06/15/23 Right 06/29/23 Right 07/20/23 Left eval  Hip flexion       Hip extension       Hip abduction       Hip adduction       Hip internal rotation       Hip external rotation       Knee flexion 113; pain begins at 100 degrees 114 112 (prior to pain) 124 (maximum AROM) 122 (prior to pain) 134 (maximum AROM) 143  Knee extension 9 3 0 0 0  Ankle dorsiflexion       Ankle plantarflexion       Ankle inversion       Ankle eversion        (Blank rows = not tested)  LOWER EXTREMITY MMT: not tested at this time due to symptom irritability  LOWER EXTREMITY SPECIAL TESTS:  Knee special tests: Anterior drawer test: negative, Posterior drawer test:  negative, McMurray's test: positive , and MCL testing: negative; LCL testing: no ligamentous laxity, but significant reproduction in familiar pain  GAIT: Assistive device utilized:  right knee brace Level of assistance: Complete Independence Comments: decreased stance time on RLE, right knee flexed in stance phase, and unable to achieve terminal knee extension  TODAY'S TREATMENT:                                                                                                                              DATE:                                   07/20/23 EXERCISE LOG  Exercise Repetitions and Resistance Comments  Nustep  L4 x 13 minutes     Sit to stand  15 reps With weight shift onto RLE   SLS  2 x 30 seconds   Wall squat 10 reps w/ 5 second hold    Standing donkey kick  20 reps  RLE only   Thomas stretch  3 x 30 seconds     Blank cell = exercise not performed today   07/13/23 EXERCISE LOG  Exercise Repetitions and Resistance Comments  Nustep  L4 x 16 minutes   LAQ 4# x x12 reps RLE only   Standing hip flexion with knee extension 4# x 15 reps  RLE only   Standing HS curl  4# x15 reps  RLE only  Step down 4" step x15 reps   R LAQ 5# x15 reps        Blank cell = exercise not performed today   Modalities  Date: 07/13/23 Vaso: Knee, 34 degrees; low pressure, 10 mins, Pain  PATIENT EDUCATION:  Education details: progress with therapy, POC, HEP (modification and progression), and objective measures Person educated: Patient Education method: Explanation Education comprehension: verbalized understanding  HOME EXERCISE PROGRAM: [id number] on 06/29/23 [id number] on 07/20/23  ASSESSMENT:  CLINICAL IMPRESSION: Patient has made good progress with skilled physical therapy as evidenced by her subjective reports, objective measures, functional mobility, and progress toward her goals. She was able to meet most of her goals for skilled physical therapy. However, she continues to experience increased right knee pain with end range knee flexion. Her HEP was updated and she was able to properly demonstrate these interventions. She reported feeling comfortable performing these interventions. She felt comfortable being discharged at this time.   PHYSICAL THERAPY DISCHARGE SUMMARY  Visits from Start of Care: 10  Current functional level related to goals / functional outcomes: Patient was able to meet most of her goals for skilled physical therapy. She reported feeling comfortable being discharged at this time.    Remaining deficits: Pain    Education / Equipment: HEP   Patient agrees to discharge. Patient goals were  partially met. Patient is being discharged due to being pleased with the current functional level.  OBJECTIVE IMPAIRMENTS: Abnormal gait, decreased activity tolerance, decreased mobility, difficulty walking, decreased ROM, decreased strength, hypomobility, impaired tone, and pain.   ACTIVITY LIMITATIONS: carrying, lifting, standing, squatting, stairs, transfers, and locomotion level  PARTICIPATION LIMITATIONS: meal prep, cleaning, laundry, driving, shopping, community activity, and yard work  PERSONAL FACTORS: Past/current experiences and Time since onset of injury/illness/exacerbation are also affecting patient's functional outcome.   REHAB POTENTIAL: Good  CLINICAL DECISION MAKING: Evolving/moderate complexity  EVALUATION COMPLEXITY: Moderate  GOALS: Goals reviewed with patient? Yes  SHORT TERM GOALS: Target date: 06/20/23 Patient will be independent with her initial HEP. Baseline: Goal status: MET  2.  Patient will be able to complete her daily activities without her familiar pain exceeding 4/10. Baseline:  Goal status: On-going  3.  Patient will be able to demonstrate at least 120 degrees of right knee flexion for improved function navigating stairs. Baseline:  Goal status: MET  4.  Patient will be able to demonstrate right knee extension within 5 degrees of neutral for improved gait mechanics. Baseline:  Goal status: MET  LONG TERM GOALS: Target date: 07/11/23  Patient will be independent with her advanced HEP. Baseline:  Goal status: MET  2.  Patient will be able to complete her daily activities without her familiar symptoms exceeding 2/10. Baseline:  Goal status: On-going  3.  Patient will be able to ambulate with no significant gait deviations. Baseline:  Goal status: MET  4.  Patient will be able to navigate at least 4 steps with a reciprocal pattern for improved household mobility. Baseline: varies depending on the step  Goal status: MET  5.  Patient  will report being able to garden without being limited by her familiar right knee symptoms. Baseline: able to garden with modifications Goal status: PARTIALLY MET  PLAN:  PT FREQUENCY: 1-2x/week  PT DURATION: 6 weeks  PLANNED INTERVENTIONS: Therapeutic exercises, Therapeutic activity, Neuromuscular re-education, Balance training, Gait training, Patient/Family education, Self Care, Joint mobilization, Stair training, Electrical stimulation, Cryotherapy, Moist heat, Taping, Vasopneumatic device, Manual therapy, and Re-evaluation  PLAN FOR NEXT SESSION: NuStep, review and update HEP as needed, lower extremity isometrics, manual therapy, and modalities as needed  Granville Lewis, PT 07/20/2023, 1:09 PM
